# Patient Record
Sex: Female | Born: 1965 | Race: White | Hispanic: No | Smoking: Former smoker
Health system: Southern US, Community
[De-identification: ages and names within clinical notes are randomized; demographics above are authoritative.]

## PROBLEM LIST (undated history)

## (undated) DIAGNOSIS — G43909 Migraine, unspecified, not intractable, without status migrainosus: Secondary | ICD-10-CM

## (undated) DIAGNOSIS — R42 Dizziness and giddiness: Secondary | ICD-10-CM

## (undated) DIAGNOSIS — M549 Dorsalgia, unspecified: Secondary | ICD-10-CM

## (undated) DIAGNOSIS — F3281 Premenstrual dysphoric disorder: Secondary | ICD-10-CM

## (undated) DIAGNOSIS — N870 Mild cervical dysplasia: Secondary | ICD-10-CM

## (undated) DIAGNOSIS — F419 Anxiety disorder, unspecified: Secondary | ICD-10-CM

## (undated) DIAGNOSIS — E282 Polycystic ovarian syndrome: Secondary | ICD-10-CM

## (undated) DIAGNOSIS — F1020 Alcohol dependence, uncomplicated: Secondary | ICD-10-CM

## (undated) DIAGNOSIS — F909 Attention-deficit hyperactivity disorder, unspecified type: Secondary | ICD-10-CM

## (undated) DIAGNOSIS — I10 Essential (primary) hypertension: Secondary | ICD-10-CM

## (undated) DIAGNOSIS — K2270 Barrett's esophagus without dysplasia: Secondary | ICD-10-CM

## (undated) DIAGNOSIS — E559 Vitamin D deficiency, unspecified: Secondary | ICD-10-CM

## (undated) DIAGNOSIS — F319 Bipolar disorder, unspecified: Secondary | ICD-10-CM

## (undated) DIAGNOSIS — J449 Chronic obstructive pulmonary disease, unspecified: Secondary | ICD-10-CM

## (undated) DIAGNOSIS — F4310 Post-traumatic stress disorder, unspecified: Secondary | ICD-10-CM

## (undated) DIAGNOSIS — J439 Emphysema, unspecified: Secondary | ICD-10-CM

## (undated) HISTORY — PX: MOUTH SURGERY: SHX715

## (undated) HISTORY — PX: NASAL SEPTUM SURGERY: SHX37

## (undated) HISTORY — DX: Dizziness and giddiness: R42

## (undated) HISTORY — DX: Polycystic ovarian syndrome: E28.2

## (undated) HISTORY — PX: TONSILECTOMY, ADENOIDECTOMY, BILATERAL MYRINGOTOMY AND TUBES: SHX2538

## (undated) HISTORY — DX: Mild cervical dysplasia: N87.0

## (undated) HISTORY — DX: Migraine, unspecified, not intractable, without status migrainosus: G43.909

## (undated) HISTORY — DX: Dorsalgia, unspecified: M54.9

## (undated) HISTORY — DX: Alcohol dependence, uncomplicated: F10.20

## (undated) HISTORY — DX: Post-traumatic stress disorder, unspecified: F43.10

## (undated) HISTORY — DX: Emphysema, unspecified: J43.9

## (undated) HISTORY — DX: Anxiety disorder, unspecified: F41.9

## (undated) HISTORY — DX: Premenstrual dysphoric disorder: F32.81

## (undated) HISTORY — DX: Barrett's esophagus without dysplasia: K22.70

## (undated) HISTORY — DX: Chronic obstructive pulmonary disease, unspecified: J44.9

## (undated) HISTORY — DX: Attention-deficit hyperactivity disorder, unspecified type: F90.9

## (undated) HISTORY — DX: Essential (primary) hypertension: I10

## (undated) HISTORY — DX: Vitamin D deficiency, unspecified: E55.9

## (undated) HISTORY — PX: BACK SURGERY: SHX140

## (undated) HISTORY — DX: Bipolar disorder, unspecified: F31.9

---

## 1993-12-11 HISTORY — PX: CERVICAL BIOPSY  W/ LOOP ELECTRODE EXCISION: SUR135

## 1993-12-11 HISTORY — PX: COLPOSCOPY: SHX161

## 2001-08-14 ENCOUNTER — Other Ambulatory Visit: Admission: RE | Admit: 2001-08-14 | Discharge: 2001-08-14 | Payer: Self-pay | Admitting: Obstetrics and Gynecology

## 2002-10-03 ENCOUNTER — Other Ambulatory Visit: Admission: RE | Admit: 2002-10-03 | Discharge: 2002-10-03 | Payer: Self-pay | Admitting: Obstetrics and Gynecology

## 2005-05-01 ENCOUNTER — Encounter
Admission: RE | Admit: 2005-05-01 | Discharge: 2005-07-30 | Payer: Self-pay | Admitting: Physical Medicine and Rehabilitation

## 2005-05-03 ENCOUNTER — Ambulatory Visit: Payer: Self-pay | Admitting: Physical Medicine and Rehabilitation

## 2005-06-08 ENCOUNTER — Ambulatory Visit: Payer: Self-pay | Admitting: Physical Medicine and Rehabilitation

## 2005-06-22 ENCOUNTER — Other Ambulatory Visit: Admission: RE | Admit: 2005-06-22 | Discharge: 2005-06-22 | Payer: Self-pay | Admitting: Addiction Medicine

## 2005-06-27 ENCOUNTER — Encounter
Admission: RE | Admit: 2005-06-27 | Discharge: 2005-08-11 | Payer: Self-pay | Admitting: Physical Medicine and Rehabilitation

## 2005-07-18 ENCOUNTER — Ambulatory Visit: Payer: Self-pay | Admitting: Physical Medicine and Rehabilitation

## 2005-08-11 ENCOUNTER — Encounter
Admission: RE | Admit: 2005-08-11 | Discharge: 2005-11-09 | Payer: Self-pay | Admitting: Physical Medicine and Rehabilitation

## 2005-12-12 ENCOUNTER — Encounter: Admission: RE | Admit: 2005-12-12 | Discharge: 2006-03-12 | Payer: Self-pay | Admitting: Family Medicine

## 2006-12-09 ENCOUNTER — Emergency Department (HOSPITAL_COMMUNITY): Admission: EM | Admit: 2006-12-09 | Discharge: 2006-12-09 | Payer: Self-pay | Admitting: Emergency Medicine

## 2007-11-03 ENCOUNTER — Emergency Department (HOSPITAL_COMMUNITY): Admission: EM | Admit: 2007-11-03 | Discharge: 2007-11-03 | Payer: Self-pay | Admitting: Family Medicine

## 2008-04-13 ENCOUNTER — Emergency Department (HOSPITAL_COMMUNITY): Admission: EM | Admit: 2008-04-13 | Discharge: 2008-04-13 | Payer: Self-pay | Admitting: Family Medicine

## 2008-08-15 ENCOUNTER — Emergency Department (HOSPITAL_COMMUNITY): Admission: EM | Admit: 2008-08-15 | Discharge: 2008-08-15 | Payer: Self-pay | Admitting: Emergency Medicine

## 2008-11-19 ENCOUNTER — Other Ambulatory Visit: Admission: RE | Admit: 2008-11-19 | Discharge: 2008-11-19 | Payer: Self-pay | Admitting: Obstetrics and Gynecology

## 2008-11-19 ENCOUNTER — Ambulatory Visit: Payer: Self-pay | Admitting: Obstetrics and Gynecology

## 2008-11-19 ENCOUNTER — Encounter: Payer: Self-pay | Admitting: Obstetrics and Gynecology

## 2008-11-23 ENCOUNTER — Ambulatory Visit: Payer: Self-pay | Admitting: Obstetrics and Gynecology

## 2009-04-28 ENCOUNTER — Ambulatory Visit: Payer: Self-pay | Admitting: Obstetrics and Gynecology

## 2009-04-29 ENCOUNTER — Ambulatory Visit: Payer: Self-pay | Admitting: Obstetrics and Gynecology

## 2009-05-14 ENCOUNTER — Emergency Department (HOSPITAL_COMMUNITY): Admission: EM | Admit: 2009-05-14 | Discharge: 2009-05-14 | Payer: Self-pay | Admitting: Family Medicine

## 2009-05-31 ENCOUNTER — Encounter: Admission: RE | Admit: 2009-05-31 | Discharge: 2009-05-31 | Payer: Self-pay | Admitting: Obstetrics and Gynecology

## 2009-07-02 ENCOUNTER — Encounter: Admission: RE | Admit: 2009-07-02 | Discharge: 2009-07-02 | Payer: Self-pay | Admitting: Family Medicine

## 2009-07-05 ENCOUNTER — Ambulatory Visit: Payer: Self-pay | Admitting: Internal Medicine

## 2009-07-07 ENCOUNTER — Inpatient Hospital Stay (HOSPITAL_COMMUNITY): Admission: EM | Admit: 2009-07-07 | Discharge: 2009-07-09 | Payer: Self-pay | Admitting: Emergency Medicine

## 2009-07-12 LAB — CBC WITH DIFFERENTIAL/PLATELET
BASO%: 1.1 % (ref 0.0–2.0)
Eosinophils Absolute: 0.1 10*3/uL (ref 0.0–0.5)
HCT: 40.9 % (ref 34.8–46.6)
LYMPH%: 17.5 % (ref 14.0–49.7)
MONO#: 0.6 10*3/uL (ref 0.1–0.9)
NEUT#: 8.5 10*3/uL — ABNORMAL HIGH (ref 1.5–6.5)
NEUT%: 75.4 % (ref 38.4–76.8)
Platelets: 343 10*3/uL (ref 145–400)
RBC: 4.41 10*6/uL (ref 3.70–5.45)
WBC: 11.3 10*3/uL — ABNORMAL HIGH (ref 3.9–10.3)
lymph#: 2 10*3/uL (ref 0.9–3.3)

## 2009-07-14 LAB — COMPREHENSIVE METABOLIC PANEL
ALT: 32 U/L (ref 0–35)
CO2: 29 mEq/L (ref 19–32)
Calcium: 9.3 mg/dL (ref 8.4–10.5)
Chloride: 102 mEq/L (ref 96–112)
Glucose, Bld: 145 mg/dL — ABNORMAL HIGH (ref 70–99)
Sodium: 139 mEq/L (ref 135–145)
Total Bilirubin: 0.2 mg/dL — ABNORMAL LOW (ref 0.3–1.2)
Total Protein: 6.1 g/dL (ref 6.0–8.3)

## 2009-07-14 LAB — LACTATE DEHYDROGENASE: LDH: 178 U/L (ref 94–250)

## 2009-08-26 ENCOUNTER — Ambulatory Visit: Payer: Self-pay | Admitting: Obstetrics and Gynecology

## 2009-10-08 ENCOUNTER — Encounter: Admission: RE | Admit: 2009-10-08 | Discharge: 2009-10-08 | Payer: Self-pay | Admitting: Orthopedic Surgery

## 2009-10-29 ENCOUNTER — Emergency Department (HOSPITAL_COMMUNITY): Admission: EM | Admit: 2009-10-29 | Discharge: 2009-10-30 | Payer: Self-pay | Admitting: Emergency Medicine

## 2009-10-30 ENCOUNTER — Ambulatory Visit: Payer: Self-pay | Admitting: Psychiatry

## 2009-10-30 ENCOUNTER — Inpatient Hospital Stay (HOSPITAL_COMMUNITY): Admission: AD | Admit: 2009-10-30 | Discharge: 2009-11-01 | Payer: Self-pay | Admitting: Psychiatry

## 2009-12-07 ENCOUNTER — Other Ambulatory Visit: Admission: RE | Admit: 2009-12-07 | Discharge: 2009-12-07 | Payer: Self-pay | Admitting: Obstetrics and Gynecology

## 2009-12-07 ENCOUNTER — Ambulatory Visit: Payer: Self-pay | Admitting: Obstetrics and Gynecology

## 2010-03-14 ENCOUNTER — Ambulatory Visit: Payer: Self-pay | Admitting: Obstetrics and Gynecology

## 2010-04-06 ENCOUNTER — Ambulatory Visit: Payer: Self-pay | Admitting: Obstetrics and Gynecology

## 2010-04-08 ENCOUNTER — Ambulatory Visit: Payer: Self-pay | Admitting: Obstetrics and Gynecology

## 2010-09-05 ENCOUNTER — Ambulatory Visit: Payer: Self-pay | Admitting: Gynecology

## 2010-09-07 ENCOUNTER — Ambulatory Visit: Payer: Self-pay | Admitting: Obstetrics and Gynecology

## 2010-09-08 ENCOUNTER — Ambulatory Visit: Payer: Self-pay | Admitting: Obstetrics and Gynecology

## 2010-09-14 ENCOUNTER — Encounter: Admission: RE | Admit: 2010-09-14 | Discharge: 2010-09-14 | Payer: Self-pay | Admitting: Family Medicine

## 2010-09-15 ENCOUNTER — Encounter: Admission: RE | Admit: 2010-09-15 | Discharge: 2010-09-15 | Payer: Self-pay | Admitting: Family Medicine

## 2010-09-28 ENCOUNTER — Ambulatory Visit: Payer: Self-pay | Admitting: Obstetrics and Gynecology

## 2010-10-02 ENCOUNTER — Ambulatory Visit (HOSPITAL_COMMUNITY): Admission: RE | Admit: 2010-10-02 | Discharge: 2010-10-02 | Payer: Self-pay | Admitting: Psychiatry

## 2010-10-04 ENCOUNTER — Other Ambulatory Visit (HOSPITAL_COMMUNITY): Admission: RE | Admit: 2010-10-04 | Discharge: 2010-10-10 | Payer: Self-pay | Admitting: Psychiatry

## 2010-10-07 ENCOUNTER — Ambulatory Visit: Payer: Self-pay | Admitting: Obstetrics and Gynecology

## 2010-10-13 ENCOUNTER — Ambulatory Visit: Payer: Self-pay | Admitting: Psychiatry

## 2011-01-01 ENCOUNTER — Encounter: Payer: Self-pay | Admitting: Obstetrics and Gynecology

## 2011-01-02 ENCOUNTER — Encounter: Payer: Self-pay | Admitting: Family Medicine

## 2011-01-02 ENCOUNTER — Encounter: Payer: Self-pay | Admitting: Gastroenterology

## 2011-01-24 ENCOUNTER — Inpatient Hospital Stay (HOSPITAL_COMMUNITY)
Admission: AD | Admit: 2011-01-24 | Discharge: 2011-01-28 | DRG: 885 | Disposition: A | Discharge status: Home / Self Care | Payer: PRIVATE HEALTH INSURANCE | Source: Ambulatory Visit | Attending: Psychiatry | Admitting: Psychiatry

## 2011-01-24 ENCOUNTER — Emergency Department (HOSPITAL_COMMUNITY)
Admission: EM | Admit: 2011-01-24 | Discharge: 2011-01-24 | Disposition: A | Discharge status: Other Acute Inpatient Hospital | Payer: PRIVATE HEALTH INSURANCE | Attending: Emergency Medicine | Admitting: Emergency Medicine

## 2011-01-24 DIAGNOSIS — F909 Attention-deficit hyperactivity disorder, unspecified type: Secondary | ICD-10-CM | POA: Insufficient documentation

## 2011-01-24 DIAGNOSIS — E119 Type 2 diabetes mellitus without complications: Secondary | ICD-10-CM | POA: Insufficient documentation

## 2011-01-24 DIAGNOSIS — J449 Chronic obstructive pulmonary disease, unspecified: Secondary | ICD-10-CM

## 2011-01-24 DIAGNOSIS — F319 Bipolar disorder, unspecified: Secondary | ICD-10-CM | POA: Insufficient documentation

## 2011-01-24 DIAGNOSIS — J45909 Unspecified asthma, uncomplicated: Secondary | ICD-10-CM | POA: Insufficient documentation

## 2011-01-24 DIAGNOSIS — R45851 Suicidal ideations: Secondary | ICD-10-CM

## 2011-01-24 DIAGNOSIS — F4310 Post-traumatic stress disorder, unspecified: Secondary | ICD-10-CM | POA: Insufficient documentation

## 2011-01-24 DIAGNOSIS — R51 Headache: Secondary | ICD-10-CM

## 2011-01-24 DIAGNOSIS — J4489 Other specified chronic obstructive pulmonary disease: Secondary | ICD-10-CM

## 2011-01-24 DIAGNOSIS — F3130 Bipolar disorder, current episode depressed, mild or moderate severity, unspecified: Principal | ICD-10-CM

## 2011-01-24 LAB — CBC
MCH: 32.1 pg (ref 26.0–34.0)
MCHC: 34.2 g/dL (ref 30.0–36.0)
MCV: 93.9 fL (ref 78.0–100.0)
Platelets: 433 10*3/uL — ABNORMAL HIGH (ref 150–400)
RDW: 12.8 % (ref 11.5–15.5)
WBC: 15.1 10*3/uL — ABNORMAL HIGH (ref 4.0–10.5)

## 2011-01-24 LAB — COMPREHENSIVE METABOLIC PANEL
Albumin: 3.8 g/dL (ref 3.5–5.2)
BUN: 10 mg/dL (ref 6–23)
Calcium: 9 mg/dL (ref 8.4–10.5)
Creatinine, Ser: 0.89 mg/dL (ref 0.4–1.2)
Potassium: 4 mEq/L (ref 3.5–5.1)
Total Protein: 6.9 g/dL (ref 6.0–8.3)

## 2011-01-24 LAB — RAPID URINE DRUG SCREEN, HOSP PERFORMED
Amphetamines: POSITIVE — AB
Barbiturates: NOT DETECTED
Benzodiazepines: POSITIVE — AB
Cocaine: NOT DETECTED
Opiates: NOT DETECTED

## 2011-01-24 LAB — DIFFERENTIAL
Eosinophils Absolute: 0.1 10*3/uL (ref 0.0–0.7)
Eosinophils Relative: 1 % (ref 0–5)
Lymphs Abs: 3.4 10*3/uL (ref 0.7–4.0)

## 2011-01-24 LAB — CARBAMAZEPINE LEVEL, TOTAL: Carbamazepine Lvl: <2 ug/mL — ABNORMAL LOW (ref 4.0–12.0)

## 2011-01-25 DIAGNOSIS — F319 Bipolar disorder, unspecified: Secondary | ICD-10-CM

## 2011-01-25 LAB — GLUCOSE, CAPILLARY: Glucose-Capillary: 108 mg/dL — ABNORMAL HIGH (ref 70–99)

## 2011-01-26 LAB — URINALYSIS, ROUTINE W REFLEX MICROSCOPIC
Bilirubin Urine: NEGATIVE
Ketones, ur: NEGATIVE mg/dL
Nitrite: NEGATIVE
Protein, ur: NEGATIVE mg/dL
Urine Glucose, Fasting: NEGATIVE mg/dL
pH: 7.5 (ref 5.0–8.0)

## 2011-01-26 LAB — GLUCOSE, CAPILLARY
Glucose-Capillary: 103 mg/dL — ABNORMAL HIGH (ref 70–99)
Glucose-Capillary: 98 mg/dL (ref 70–99)

## 2011-01-30 LAB — GLUCOSE, CAPILLARY: Glucose-Capillary: 112 mg/dL — ABNORMAL HIGH (ref 70–99)

## 2011-02-01 NOTE — Discharge Summary (Signed)
Angelica [name], [name]             ACCOUNT NO.:  [id number]  MEDICAL RECORD NO.:  [id number]           PATIENT TYPE:  I  LOCATION:  0400                          FACILITY:  [facility]  PHYSICIAN:  Eulogio Ditch, MD DATE OF BIRTH:  May 24, 1966  DATE OF ADMISSION:  01/24/2011 DATE OF DISCHARGE:  01/28/2011                              DISCHARGE SUMMARY   IDENTIFYING INFORMATION:  A 45 year old female.  This is a voluntary admission.  HISTORY OF PRESENT ILLNESS:  [name] presented complaining of depression and suicidal thoughts after having had only few hours of sleep.  Had some conflict with her mother regarding some other family stressors and also was complaining of significant headache.  She denied any substance abuse.  This is her second admission to the unit, last here in November 2010 for an overdose of Klonopin.  She is followed as an outpatient by Dr. Phillip Heal.  An additional stressor was her process of recently transitioning from Tegretol to lithium.  She has a history of mood disorder.  MEDICAL EVALUATION/DIAGNOSTIC STUDIES:  Physical exam was done in the emergency room, and as noted in the record, this is a normally developed female appearing to be her stated age with a nonfocal neuro exam, smooth motor, and in full contact with reality.  CHRONIC MEDICAL PROBLEMS: 1. Perennial allergies. 2. Asthma. 3. History of attention deficit. 4. Diabetes. 5. Bipolar disorder.  CBC was unremarkable.  Electrolytes normal.  BUN 10, creatinine 0.88. Normal liver enzymes.  Urine drug screen positive for benzodiazepines and amphetamines.  Lithium level 0.38.  Carbamazepine level less than 2.  ADMITTING MENTAL STATUS EXAM:  Fully alert, cooperative, normally- developed.  Appears to be her stated age.  Mood anxious.  Affect congruent, somewhat tearful, slightly labile.  Wanting to be back on all of her medications and asking for help getting stabilized on her meds. Thought  processes coherent and thinking nonpsychotic.  No delusional statements.  No flight of ideas.  Immediate, recent, remote memory intact.  Cognition intact.  DIAGNOSES:  AXIS I:  Bipolar disorder, not otherwise specified.  AXIS II:  Deferred.  AXIS III: 1. Chronic obstructive pulmonary disease . 2. Type 2 diabetes. 3. Seasonal allergies. 4. Headaches.  AXIS IV:  Psychosocial issues and some chronic domestic conflict.  AXIS V:  Current 35, past year not known.  COURSE OF HOSPITALIZATION:  She was admitted to our stabilization unit, and at one point was taking Seroquel, more recently taking Geodon with discussion of risks and benefits.  She agreed to discontinue the Geodon, and we elected to continue her Seroquel at 600 mg p.o. q.h.s.  To treat her headache, we initially started her on Excedrin P.M., but her response was suboptimal.  She characterized this is a typical migraine headache, and she responded well to 30 mg of Toradol IM along with Phenergan 25 mg IM on the morning of January 26, 2011. The morning of 17th, affect was much fuller, more relaxed.  Feeling subjectively much better.  Thinking was more logical, goal-directed. She looked much more relaxed.  She reported that she had been recovering from an eating disorder and agoraphobia.  Endorsing that she needed to have more structure in her life.  By the 18th, affect full, sleep and appetite good.  Headache resolved. Endorsed strong support from her family.  She was in agreement with the plan to discharge.  DISCHARGE DIAGNOSES:  AXIS I:  Bipolar disorder, depressed.  AXIS II:  No diagnosis.  AXIS III: 1. Headache, resolved. 2. Asthma. 3. Type 2 diabetes, stable.  AXIS IV:  Supportive family and stable home situation is an asset.  AXIS V:  Current 56, past year not known.  DISCHARGE MEDICATIONS: 1. Klonopin 1 mg q.a.m. 2. Klonopin 2.5 mg q.h.s. 3. Seroquel 600 mg q.h.s. 4. Sertraline 100 mg daily. 5.  Ambien 10 mg q.h.s. 6. Combivent 2 puffs q.6h. p.r.n. asthma. 7. Dulera 2 puffs b.i.d. 8. Flonase 2 sprays daily, both nostrils. 9. Lithium carbonate 300 mg b.i.d. 10.Metformin 500 mg b.i.d. 11.Multivitamin 1 daily. 12.Singulair 10 mg daily. 13.Topamax 100 mg b.i.d. 14.Vitamin D2 50,000 units weekly on Saturdays. 15.Vyvanse 70 mg q.a.m. 16.Xopenex via nebulizer q.6h. as needed for asthma.  She was instructed to stop the sertraline 150 mg q.a.m. and to stop the Tegretol and the Geodon and the pseudoephedrine that she had previously been taking.     Margaret A. Lorin Picket, N.P.   ______________________________ Eulogio Ditch, MD    MAS/MEDQ  D:  01/30/2011  T:  01/30/2011  Job:  [id number]  Electronically Signed by Kari Baars N.P. on 01/31/2011 08:56:42 AM Electronically Signed by Eulogio Ditch  on 02/01/2011 04:48:20 AM

## 2011-02-01 NOTE — H&P (Signed)
Angelica [name], Angelica [name]             ACCOUNT NO.:  [id number]  MEDICAL RECORD NO.:  [id number]           PATIENT TYPE:  I  LOCATION:  0400                          [facility]ACILITY:  [facility]  PHYSICIAN:  Eulogio Ditch, MD DATE OF BIRTH:  02/05/1966  DATE OF ADMISSION:  01/24/2011 DATE OF DISCHARGE:                      PSYCHIATRIC ADMISSION ASSESSMENT   IDENTIFICATION:  This is a 45 year old female voluntarily admitted on January 24, 2011.  HISTORY OF PRESENT ILLNESS:  The patient was seen in conjunction with Eulogio Ditch, MD  The patient was admitted, assessed in the emergency department for depression and suicidal thoughts, having a few hours of sleep.  She had no plan, reporting an argument with her mother having some other significant stressors. Also complaining of headache.  Denies any substance use.  PAST PSYCHIATRIC HISTORY:  This is her second admission. The patient was last here in November 2010 for an overdose on Klonopin and her outpatient provider is Dr. Phillip Heal.  SOCIAL HISTORY:  This is a single female.  She resides in [place]. She is self-employed.  No legal issues.  FAMILY HISTORY:  None.  ALCOHOL/DRUG HISTORY:  The patient denies any recurrent alcohol or substance use.  Does have a past history of opiate use.  PRIMARY CARE PROVIDER:  Dr. Laurine Blazer.  MEDICAL PROBLEMS:  A history of type 2 diabetes, asthma and headaches. Seasonal allergies.  MEDICATIONS: 1. __________ 200 mg +500 two puffs b.i.d. 2. Vitamin D 50,000 units one weekly. 3. Multivitamin one daily. 4. Ocean nasal spray 2 sprays daily. 5. Pseudoephedrine 120 one tablet t.i.d. 6. Tylenol as needed. 7. Zyrtec q.h.s. 8. __________  spray. 9. Zoloft 150 mg daily. 10.Xopenex inhaled. 11.Vyvanse 70 mg daily. 12.Topamax 100 mg b.i.d. 13.Tegretol 200 mg 1-1/2 tablets daily. 14.Singulair 10 mg daily. 15.Seroquel 600 mg q.h.s. 16.Metformin 500 mg 1 tablet b.i.d. 17.Lithium carbonate 300  mg one b.i.d. 18.Lidocaine as needed. 19.Klonopin 1 mg t.i.d. 2.5 q.h.s. 20.Geodon 80 mg one b.i.d. 21.Flonase 2 sprays daily. 22.Combivent 2 puffs q.6 h p.r.n. 23.Ambien daily. 24.Xopenex inhaler as needed.  DRUG ALLERGIES:  CODEINE.  PHYSICAL EXAM:  This is a normally-developed female assessed in the emergency department.  Her physical exam was reviewed noted to be tremulous. Cardiovascular normal sinus rhythm at time of exam.  She was also anxious, argumentative and tearful.  LABORATORY DATA:  Glucose 114.  CBC shows a white count at 15.1.  Urine drug screen positive for benzodiazepines, positive for amphetamines, lithium level of 0.38, carbamazepine less than 2, alcohol level less than 5.  MENTAL STATUS EXAM:  The patient is fully alert and cooperative.  She did appear anxious and tearful and having a labile mood, asking to be put back on her medications.  Her thought processes are coherent and does not appear to be psychotic.  She denies any suicidal thoughts but as stated does appear anxious and seems have some labile mood.  AXIS I:  Bipolar disorder. AXIS II:  Deferred. AXIS III:  COPD and type 2 diabetes, seasonal allergies and headaches. AXIS IV:  Psychosocial problems, possible problems with a primary support of her mother. AXIS V:  Current is 35-40.  Our  plan is to review her medication regime.  We will attempt to simplify her medications.  We will stop her Geodon and continue with her Seroquel.  The patient reports her Tegretol was being crossed tapered with lithium.  We will check her CBG's b.i.d.  Contact mother for a family session and concerns, have Excedrin PM available for her headaches.  We will also decrease her Zoloft to 100 mg as that may be adding to some of her mood instability.  Her tentative length of stay at this time is 4 to 5 days.     Landry Corporal, N.P.   ______________________________ Eulogio Ditch, MD    JO/MEDQ  D:   01/27/2011  T:  01/27/2011  Job:  [id number]  Electronically Signed by Limmie PatriciaP. on 01/30/2011 09:53:45 AM Electronically Signed by Eulogio Ditch  on 02/01/2011 04:48:11 AM

## 2011-03-15 LAB — BASIC METABOLIC PANEL
BUN: 7 mg/dL (ref 6–23)
CO2: 30 mEq/L (ref 19–32)
GFR calc non Af Amer: >60 mL/min (ref >60)
Glucose, Bld: 117 mg/dL — ABNORMAL HIGH (ref 70–99)
Potassium: 3.6 mEq/L (ref 3.5–5.1)
Sodium: 140 mEq/L (ref 135–145)

## 2011-03-15 LAB — DIFFERENTIAL
Basophils Absolute: 0.3 10*3/uL — ABNORMAL HIGH (ref 0.0–0.1)
Basophils Relative: 2 % — ABNORMAL HIGH (ref 0–1)
Eosinophils Absolute: 0.1 10*3/uL (ref 0.0–0.7)
Eosinophils Relative: 0 % (ref 0–5)
Lymphocytes Relative: 27 % (ref 12–46)
Monocytes Absolute: 1 10*3/uL (ref 0.1–1.0)

## 2011-03-15 LAB — HEPATIC FUNCTION PANEL
AST: 19 U/L (ref 0–37)
Bilirubin, Direct: <0.1 mg/dL (ref 0.0–0.3)
Total Bilirubin: 0.5 mg/dL (ref 0.3–1.2)

## 2011-03-15 LAB — CBC
HCT: 43.4 % (ref 36.0–46.0)
Hemoglobin: 14.7 g/dL (ref 12.0–15.0)
MCHC: 33.9 g/dL (ref 30.0–36.0)
MCV: 93.7 fL (ref 78.0–100.0)
Platelets: 403 10*3/uL — ABNORMAL HIGH (ref 150–400)
RDW: 14 % (ref 11.5–15.5)

## 2011-03-15 LAB — ACETAMINOPHEN LEVEL: Acetaminophen (Tylenol), Serum: <10 ug/mL — ABNORMAL LOW (ref 10–30)

## 2011-03-15 LAB — RAPID URINE DRUG SCREEN, HOSP PERFORMED
Benzodiazepines: POSITIVE — AB
Cocaine: NOT DETECTED
Opiates: NOT DETECTED

## 2011-03-15 LAB — SALICYLATE LEVEL: Salicylate Lvl: <4 mg/dL (ref 2.8–20.0)

## 2011-03-19 LAB — CBC
HCT: 42 % (ref 36.0–46.0)
Hemoglobin: 14.2 g/dL (ref 12.0–15.0)
MCV: 93.2 fL (ref 78.0–100.0)
Platelets: 377 10*3/uL (ref 150–400)
Platelets: 429 10*3/uL — ABNORMAL HIGH (ref 150–400)
RBC: 4.3 MIL/uL (ref 3.87–5.11)
RBC: 4.5 MIL/uL (ref 3.87–5.11)
RBC: 4.72 MIL/uL (ref 3.87–5.11)
RDW: 13.8 % (ref 11.5–15.5)
WBC: 13 10*3/uL — ABNORMAL HIGH (ref 4.0–10.5)
WBC: 13.9 10*3/uL — ABNORMAL HIGH (ref 4.0–10.5)

## 2011-03-19 LAB — URINE CULTURE: Colony Count: 35000

## 2011-03-19 LAB — COMPREHENSIVE METABOLIC PANEL
ALT: 36 U/L — ABNORMAL HIGH (ref 0–35)
ALT: 36 U/L — ABNORMAL HIGH (ref 0–35)
AST: 27 U/L (ref 0–37)
AST: 28 U/L (ref 0–37)
Albumin: 3 g/dL — ABNORMAL LOW (ref 3.5–5.2)
Albumin: 3.4 g/dL — ABNORMAL LOW (ref 3.5–5.2)
CO2: 27 mEq/L (ref 19–32)
Calcium: 9.1 mg/dL (ref 8.4–10.5)
Chloride: 103 mEq/L (ref 96–112)
Chloride: 107 mEq/L (ref 96–112)
Creatinine, Ser: 0.73 mg/dL (ref 0.4–1.2)
GFR calc Af Amer: >60 mL/min (ref >60)
GFR calc Af Amer: >60 mL/min (ref >60)
GFR calc non Af Amer: >60 mL/min (ref >60)
Sodium: 141 mEq/L (ref 135–145)
Sodium: 141 mEq/L (ref 135–145)
Total Bilirubin: 0.4 mg/dL (ref 0.3–1.2)
Total Bilirubin: 0.5 mg/dL (ref 0.3–1.2)

## 2011-03-19 LAB — DIFFERENTIAL
Eosinophils Absolute: 0.1 10*3/uL (ref 0.0–0.7)
Eosinophils Relative: 1 % (ref 0–5)
Lymphocytes Relative: 20 % (ref 12–46)
Lymphs Abs: 2.7 10*3/uL (ref 0.7–4.0)
Monocytes Absolute: 1 10*3/uL (ref 0.1–1.0)

## 2011-03-19 LAB — LIPID PANEL
Cholesterol: 174 mg/dL (ref 0–200)
LDL Cholesterol: 111 mg/dL — ABNORMAL HIGH (ref 0–99)
Total CHOL/HDL Ratio: 3.9 RATIO
Triglycerides: 90 mg/dL (ref <150)
VLDL: 18 mg/dL (ref 0–40)

## 2011-03-19 LAB — AMYLASE: Amylase: 131 U/L (ref 27–131)

## 2011-03-19 LAB — RAPID URINE DRUG SCREEN, HOSP PERFORMED
Barbiturates: NOT DETECTED
Opiates: NOT DETECTED

## 2011-03-19 LAB — URINALYSIS, ROUTINE W REFLEX MICROSCOPIC
Glucose, UA: NEGATIVE mg/dL
Hgb urine dipstick: NEGATIVE
Protein, ur: NEGATIVE mg/dL
pH: 6 (ref 5.0–8.0)

## 2011-03-19 LAB — APTT: aPTT: 30 seconds (ref 24–37)

## 2011-03-19 LAB — GLUCOSE, CAPILLARY: Glucose-Capillary: 116 mg/dL — ABNORMAL HIGH (ref 70–99)

## 2011-04-25 NOTE — Discharge Summary (Signed)
NAMERAYOLA, EVERHART             ACCOUNT NO.:  [id number]   MEDICAL RECORD NO.:  [id number]          PATIENT TYPE:  INP   LOCATION:  1441                         FACILITY:  [facility]   PHYSICIAN:  Corinna L. Lendell Caprice, MDDATE OF BIRTH:  [birth year]   DATE OF ADMISSION:  07/07/2009  DATE OF DISCHARGE:  07/09/2009                               DISCHARGE SUMMARY   DISCHARGE DIAGNOSES:  1. Abdominal pain, improved.  2. Nausea, improved.  3. Elevated lipase with normal amylase and normal appearance of      pancreas on CAT scan.  4. Bipolar disorder.  5. Tobacco abuse, counseled against.  6. Right renal cyst.  7. Chronic leukocytosis per patient report.   DISCHARGE MEDICATIONS:  1. Percocet 5/325 one p.o. q.4 h. p.r.n. pain, 15 were dispensed.  2. Phenergan 12.5 mg p.o. q.4 h. p.r.n. nausea.  3. Continue Geodon 80 mg in the afternoon and 160 mg at night.  4. Klonopin 2 mg at night.  5. Zoloft 100 mg a day.  6. Vyvanse 70 mg a day.  7. Xopenex as needed.  8. Fanapt 4 mg twice a day.  9. Alvesco twice a day.  10.Nasacort inhaler twice a day.  11.Advair daily.  12.Tramadol 50 mg as needed.   CONDITION:  Stable.   ACTIVITY:  Ad lib.   FOLLOW UP:  1. Follow up with Dr. Paulino Rily next week.  2. The patient also reportedly has an outpatient appointment scheduled      with a hematologist/oncologist for chronic leukocytosis.   DIET:  Bland, low-fat.   DISCHARGE INSTRUCTIONS:  She is encouraged to quit smoking.   CONSULTATIONS:  None.   PROCEDURE:  None.   LABORATORY DATA:  Initial white blood cell count was 13,000 with a  normal differential, 429 platelets, otherwise normal CBC.  PT/PTT  normal.  Complete metabolic panel was essentially normal.  LDL 111, HDL  45, triglycerides 90, TSH 1.080.  Urine drug screen positive for  amphetamines and benzodiazepines.  Urinalysis negative.  Urine culture  negative.   DIAGNOSTICS:  1. EKG showed normal sinus rhythm.  2. Ultrasound had been  done as an outpatient prior to admission on      July 02, 2009 which showed no cholelithiasis or cholecystitis,      hepatic steatosis, large minimally complex 2 cm right renal cystic      lesion.  3. CT of the abdomen and pelvis on admission showed diffuse fatty      infiltration of the liver.  Spleen, pancreas, adrenal glands      unremarkable.  Gallbladder and biliary area system unremarkable.      Exophytic right mid kidney cyst.  Nonobstructive 2 mm left kidney      upper pole calculus, slight increase in size.  Simple appearing      right renal cyst.  Small cyst or follicle on the right ovary.  4. Two views of the chest showed minimal bronchitic change.  5. MRI of the abdomen showed Bosniak category II cyst of the right      kidney with a small hemorrhagic component, but no enhancement  or      worrisome imaging characteristics, consider benign, small right      ovarian cyst, new atelectasis at the right lower lobe, diffuse      hepatic steatosis.  6. HIDA scan was normal.   HISTORY/HOSPITAL COURSE:  Ms. Celaya is a 45 year old white female  patient of Dr. Paulino Rily who presented with a several day onset of  worsening abdominal pain, particularly in the right upper quadrant.  She  had nausea as well.  She was noted to have a leukocytosis and a slightly  elevated lipase of 110.  She had an outpatient ultrasound which was  unremarkable.  She was afebrile on admission.  She has slight right  upper quadrant tenderness and right flank tenderness.  She was admitted.  She eventually gave the history that her white blood cell count has been  elevated chronically and she is being referred to Hem/Onc as an  outpatient.  She was given supportive care.  There was concern about  possible pancreatitis based on enzymes, but her CAT scan did not show  inflammation.  Nevertheless, the patient's pain and nausea improved.  At  the time of discharge, she was tolerating a solid diet.  Her pain had   improved.  She had no tenderness.  It is unclear what caused this, but  certainly she is feeling much better and is clinically stable.  However,  I have asked that she follow closely with her primary care physician.  In case her symptoms continue, she may need further workup.   Total time on the day of discharge is 45 minutes.      Corinna L. Lendell Caprice, MD  Electronically Signed     CLS/MEDQ  D:  07/09/2009  T:  07/10/2009  Job:  [id number]   cc:   Emeterio Reeve, MD  Fax: [phone]

## 2011-04-25 NOTE — H&P (Signed)
Angelica [name], Angelica [name]             ACCOUNT NO.:  [id number]   MEDICAL RECORD NO.:  [id number]          PATIENT TYPE:  INP   LOCATION:  1441                         [facility]   PHYSICIAN:  Michiel Cowboy, MDDATE OF BIRTH:  01/28/1966   DATE OF ADMISSION:  07/07/2009  DATE OF DISCHARGE:                              HISTORY & PHYSICAL   CHIEF COMPLAINT:  Nausea, right upper quadrant/right flank pain, as well  as left upper quadrant pain.   HISTORY OF PRESENT ILLNESS:  The patient is a 45 year old female with  history of bipolar disorder, asthma, and attention deficit hyperactivity  disorder.  For about a week now she had been having intermittent right  upper quadrant as well as left upper quadrant pain.  She feels that it  is possibly worse with eating.  She was evaluated for this by her  primary care Bralen Wiltgen's office who obtained right upper quadrant that  did not show any cholecystitis or any evidence of cholelithiasis.  Since  pain continued she presented to the emergency department where a CT scan  was done that also did not show any abnormalities of the pancreas or  gallbladder.  She does have fatty liver infiltration and right renal  cyst of unclear etiology, but otherwise imaging is unremarkable.  She  does state that the pain is somewhat worse with eating.  She has a  little bit of nausea but no vomiting per se, just a lot of heartburn.  She also has been feeling somewhat lightheaded recently.  She denies  excessive alcohol abuse or drug abuse.   She denies any fevers but has had some chills.   Also the patient endorses about two weeks ago she had an episode of  suicidal ideation where she took too much Klonopin.  Since then she was  evaluated for this by psychiatry as an outpatient and currently denies  any suicidal ideation or homicidal ideation.   REVIEW OF SYSTEMS:  As above.  Otherwise unremarkable.   PAST MEDICAL HISTORY:  1. Significant for seasonal  allergies.  2. Asthma.  3. Attention deficit hyperactivity disorder.  4. Bipolar.  5. Post-traumatic stress disorder.  6. The patient also endorses that she always had tachycardia.   SOCIAL HISTORY:  The patient continues to smoke and currently uses  nicotine replacement.  The patient says she drinks about one drink per  month and has not been drinking anything recently.   FAMILY HISTORY:  Noncontributory.   ALLERGIES:  1. Allergic to CODEINE.  2. LAMICTAL.   MEDICATIONS:  1. Geodon 80 mg in the afternoon and 160 before bedtime.  2. Klonopin 2 mg at bedtime.  3. Zoloft 100 mg the morning.  4. Vyvanse 70 mg in the morning.  5. Xopenex as needed.  6. Tramadol.  7. Acetaminophen as needed for pain.  8. She was recently started on antibiotic, and only took a few days of      this.  9. Nasacort twice a day, inhaled   PHYSICAL EXAMINATION:  VITAL SIGNS:  Temperature 98.9, blood pressure  143/92, pulse 118, respirations 20, satting 95% on  room air.  GENERAL:  The patient appears to be in no acute distress.  HEENT:  Head nontraumatic.  Moist mucous membranes.  LUNGS:  Clear to auscultation bilaterally.  HEART:  Rapid but regular.  No murmurs appreciated.  ABDOMEN:  Soft, nontender, nondistended.  Abdomen is obese.  There is  slight upper quadrant tenderness and right flank tenderness.  EXTREMITIES:  Lower extremities without clubbing, cyanosis or edema.  NEUROLOGIC:  Appears to be intact.  SKIN:  Clean, dry and intact.   LABORATORY DATA:  White blood cell count 13.9, hemoglobin 14.5.  Sodium  141, potassium 3.6, creatinine 0.73.  Albumin 3.4, ALT slightly elevated  at 36.   Right upper quadrant ultrasound from July 23 showed no cholecystitis,  but right renal cyst.  CT scan from today showing normal pancreas, right  renal cyst, increased in size.  Lipase 110.   ASSESSMENT/PLAN:  This is a 45 year old female with abdominal  discomfort, mildly elevated lipase, normal pancreas  per CT scan, and no  epigastric tenderness.  1. Question pancreatitis.  I doubt that this is episode of severe      pancreatitis, given that her pain is atypical, as well as no      radiologic findings.  Consistent pancreatitis noted.  Would      observe.  For now make n.p.o.  Give IV fluids.  Recheck lipase and      see which way she trends.  See if her pain is any better with no      p.o. intake.  There is a small possibility of early pancreatitis.      Review of medications was done.  None was noted to significantly      contribute to pancreatitis.  Will also check fasting lipid panel      for triglycerides to see if that could be potential cause.  2. Right upper quadrant pain, etiology not quite clear.  CT scan did      show hepatic steatosis but no cholecystitis.  No pancreatitis was      noted on the CAT scan.  Will observe in room overnight and we will      also given the fact that.  Xanax has been known to cause increased      abdominal pain, will hold for now.  This was just recently started.  3. Right renal cyst.  Given it is increasing in size will obtain MRI      to evaluate for any possibility of renal carcinoma, although it is      much less likely.  It is hard to say if her pain is related to the      renal cyst or not.  4. Bipolar disorder.  Continue Geodon, Zoloft.  May need psych consult      to see what other options could be for her other psychiatric      medications.  5. Elevated white blood cell count, etiology unclear.  Will check UA.      Check x-ray, although the lower cuts from CT appears to not show      any evidence of pneumonia.  The patient did endorse some chills.  6. Prophylaxis.  Protonix plus SCDs.      Michiel Cowboy, MD  Electronically Signed     AVD/MEDQ  D:  07/07/2009  T:  07/07/2009  Job:  [id number]   cc:   Johnn Hai, M.D.
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
## 2011-04-28 NOTE — Assessment & Plan Note (Signed)
MEDICAL RECORD NUMBER:  [id number]   INTERVAL HISTORY:  Angelica [name] is a 45 year old single white female who is  being seen in our pain and rehabilitative clinic for chronic intermittent  low back pain.   At our last visit she was set up to get a TENS unit and undergo some  physical therapy.   She reports she has done this and she has been trained on some ergonomic  issues as well as she started some lumbar strengthening program and will be  transitioned to Pilate's type exercise.   She reports the TENS unit has been quite helpful.  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   Average pain is about 5/6 on a scale of 10, currently 4.  Pain is  intermittent and it varies in its nature, sometimes sharp, more symptoms,  more burning, sometimes goes down the left leg.   She gets good relief with her current meds.  She is sleeping well.  She  reports pain is worse with standing and bending and improves with rest and  therapy, TENS unit, medications.   She is able to walk about 30 minutes at a time four times a week.  She can  climb stairs.  She drives.  She is employed about 30 hours a week.  She is  independent with all of her self-care.   She reports intermittent bowel problems, depression, anxiety, also some  nausea, vomiting, diarrhea intermittently, abdominal pain, coughing and  wheezing.   No changes in past medical, social or family history since last visit  although she does mention she is having some of her psychiatric medications  changed a bit.  She attributes some of her anxiety due to the changes of  some of her meds.   PHYSICAL EXAMINATION:  Blood pressure currently is 132/65, pulse 113,  respirations 18, 96% saturated on room air.  She is a well-developed mildly  obese white female.  She is oriented x3.  Her affect is bright, alert,  cooperative and pleasant.  She is able to stand easily.  Gait in the room is  normal.  Heel-toe-walking is normal.  She has some increased pain with  forward flexion and more so with extension.  Reflexes are symmetric and  intact in the lower extremities.  She has no focal weakness in the lower  extremities.  No new sensory deficits.   IMPRESSION:  1.  Degenerative disk disease/facet arthropathy L4-5.  2.  Lumbago.  3.  Trochanteric bursitis.   PLAN:  Would like her to continue to follow through with her exercise  program.  We will add ibuprofen 600 mg about three to four times a week in  the afternoon p.r.n.  I would also like to eventually get her into an  aerobic conditioning program, possibly some diet education.  Overall she has  been complying well with our  recommendations.  She will continue to use the TENS unit on a p.r.n. basis  as well.  We will see her back in a month.           ______________________________  Brantley Stage, M.D.     DMK/MedQ  D:  08/16/2005 15:20:35  T:  08/16/2005 20:58:16  Job #:  [id number]   cc:   Dr. Julian Reil

## 2011-04-28 NOTE — Assessment & Plan Note (Signed)
Angelica [name] is a 45 year old divorced white female who is being seen in our  pain and rehabilitative clinic for chronic low back pain.   She is back into clinic today with complaints of bilateral flank pain, fever  and urinary frequency, which began fairly suddenly about two days ago.   She also has some chronic low back pain, which she has been seeing a  physical therapist for.  She has gone several times now and is learning  lumbar stabilization technique, proper body mechanics, and will soon be  addressing a generalized conditioning program with her over the next several  months.  Her back pain, although complicated possibly by a new etiology over  the last couple of days, is described as intermittent, sharp, burning,  stabbing.  The time of day her pain is worst is morning and evening.  The  pain is typically exacerbated with standing and activities of sitting,  improved with rest and therapy.   She is able to walk about 20 minutes at a time.  She is able to climb  stairs.  She does drive.  She works about 30 hours a week.  She works as an  Theatre stage manager.   REVIEW OF SYSTEMS:  Positive for fever, chills, diarrhea, abdominal pain,  urinary retention.  Also positive for tremor, trouble walking, spasm and  anxiety.   Denies suicidal ideation.   No new changes in past medical history other than described in the history  of present illness.  Smokes 2-1/2 packs of cigarettes a day.  Currently  lives alone.   PHYSICAL EXAMINATION TODAY:  VITAL SIGNS:  Blood pressure 147/72, pulse 102,  respirations 16, 98% saturated on room air.  GENERAL:  She is a well-developed, obese white female who does not appear in  distress today.  She is oriented x3.  Affect bright, alert, cooperative,  pleasant.  MUSCULOSKELETAL/NEUROLOGIC:  Gait is normal today.  She has some limitations  in lumbar range of motion especially with extension.  She has tenderness  with costovertebral angle  percussion bilaterally today.  Reflexes in the  lower extremities are 2+ at the knees, 2+ at the ankles.  No numbness is  noted with light touch.  Motor strength is 5/5.  Straight leg raise is  negative.   IMPRESSION:  1.  New problem, fever, flank pain and urinary frequency x2 days.  2.  Mild degenerative disk disease/facet arthropathy, L4-5.  3.  Lumbago.  4.  Bilateral trochanteric bursitis.  5.  Left neuralgia paresthetica.   PLAN:  Patient to follow up with PCP for flank pain, fever and urinary  frequency.  Will continue stabilization exercises for her lumbago.  Will get  her set up for a TENS unit.  Will add Ultram one to two tablets p.o. daily  as needed and will see her back in a month.     DMK/MedQ  D:  07/19/2005 15:38:18  T:  07/20/2005 06:39:21  Job #:  [id number]

## 2011-04-28 NOTE — Assessment & Plan Note (Signed)
MEDICAL RECORD NUMBER:  [id number]   DATE OF BIRTH:  07/05/1966   DATE OF SERVICE:  June 14, 2005   INTERVAL HISTORY:  Angelica [name] is a 45 year old divorced white female  referred by Dr. Jeral Fruit who is being seen in our pain and rehabilitative  clinic for the second time today.  She was last seen May 03, 2005.   Angelica [name] has history remarkable for degenerative disk disease and facet  arthropathy at L4-5, history of lumbago, bilateral trochanteric bursitis,  possible neuralgia paresthetica left thigh.   Since she was last seen May 24, she reports overall improvement in her pain.  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  Her average pain is between a  5 and a 6 and today it is down to 3.  She just returned from a trip to  [place].  Overall, her pain has been doing much better since she has not been  as busy with the art work.   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   Past medical history remarkable for recent abortion.   Patient is currently able to walk 30 minutes at a time.  She is able to  climb stairs.  She drives.  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   REVIEW OF SYSTEMS:  Positive for numbness, tingling, confusion, also  possibly blood sugar problems and these are being checked out by her family  doctor.   EXAM TODAY:  Blood pressure 151/88, recheck was 136/87, pulse 116,  respirations 18, 99% saturated on room air.  She is obese white female does  not appear in any distress during our interview, she is oriented x3, affect  is overall bright, alert, pleasant.  She is able to stand up without any  difficulty despite no pain behaviors in the room.  Gait is normal.  She has  fair range of motion in her lumbar spine.  Reflexes are symmetric,  in the  lower extremities intact below the knee, she has intact sensation, she  reports continued area of numbness over the left lateral thigh.  She has  tenderness at both trochanters worse on the right than on the left in an  area which is tender to palpation over the left sacroiliac joint area.   Medications at this time include the following:  Abilify, Seroquel, Zoloft,  Klonopin, Tegretol, albuterol, Flonase.   Urine drug screen negative for opiates and illegal substances.   IMPRESSION:  1.  Mild degenerative disk disease/facet arthropathy L4-5.  2.  Lumbago.  3.  Bilateral trochanteric bursitis.  4.  Left meralgia paresthetica.   PLAN:  We will have patient set up for physical therapy emphasis on postural  education, proper body mechanics, ergonomics, lumbar stabilization, we will  also have them address her bursitis bilaterally with iliotibial band  stretches, ultrasound, and have them review flareup protocols with her.  We  will see her back in a month.  May also  consider using Lidoderm as well as TENS unit with her in the future.  Overall, she is doing quite well at this point.  We will see her back in a  month.       DMK/MedQ  D:  06/14/2005 17:22:28  T:  06/14/2005 18:45:01  Job #:  [id number]   cc:  Hilda Lias, M.D.  [address]  [place], [place] [address]  Fax: [phone]   Schuyler Amor, M.D.  [address]  [address]  [place], [place] [address]  Fax: [phone]

## 2011-06-20 ENCOUNTER — Other Ambulatory Visit: Payer: Self-pay | Admitting: Family Medicine

## 2011-06-20 ENCOUNTER — Ambulatory Visit
Admission: RE | Admit: 2011-06-20 | Discharge: 2011-06-20 | Disposition: A | Discharge status: Home / Self Care | Payer: PRIVATE HEALTH INSURANCE | Source: Ambulatory Visit | Attending: Family Medicine | Admitting: Family Medicine

## 2011-06-20 DIAGNOSIS — R110 Nausea: Secondary | ICD-10-CM

## 2011-07-29 ENCOUNTER — Emergency Department (HOSPITAL_COMMUNITY)
Admission: EM | Admit: 2011-07-29 | Discharge: 2011-07-29 | Disposition: A | Discharge status: Home / Self Care | Payer: PRIVATE HEALTH INSURANCE | Attending: Emergency Medicine | Admitting: Emergency Medicine

## 2011-07-29 ENCOUNTER — Emergency Department (HOSPITAL_COMMUNITY): Payer: PRIVATE HEALTH INSURANCE

## 2011-07-29 DIAGNOSIS — Q619 Cystic kidney disease, unspecified: Secondary | ICD-10-CM | POA: Insufficient documentation

## 2011-07-29 DIAGNOSIS — R1031 Right lower quadrant pain: Secondary | ICD-10-CM | POA: Insufficient documentation

## 2011-07-29 LAB — CBC
HCT: 42.6 % (ref 36.0–46.0)
Hemoglobin: 14.4 g/dL (ref 12.0–15.0)
MCH: 31.2 pg (ref 26.0–34.0)
MCV: 92.4 fL (ref 78.0–100.0)
Platelets: 400 10*3/uL (ref 150–400)
RBC: 4.61 MIL/uL (ref 3.87–5.11)
WBC: 11 10*3/uL — ABNORMAL HIGH (ref 4.0–10.5)

## 2011-07-29 LAB — URINALYSIS, ROUTINE W REFLEX MICROSCOPIC
Glucose, UA: NEGATIVE mg/dL
Ketones, ur: NEGATIVE mg/dL
Leukocytes, UA: NEGATIVE
Protein, ur: NEGATIVE mg/dL
Urobilinogen, UA: 0.2 mg/dL (ref 0.0–1.0)

## 2011-07-29 LAB — COMPREHENSIVE METABOLIC PANEL
AST: 11 U/L (ref 0–37)
BUN: 8 mg/dL (ref 6–23)
CO2: 23 mEq/L (ref 19–32)
Calcium: 9.4 mg/dL (ref 8.4–10.5)
Chloride: 107 mEq/L (ref 96–112)
Creatinine, Ser: 0.78 mg/dL (ref 0.50–1.10)
GFR calc Af Amer: >60 mL/min (ref >60)
GFR calc non Af Amer: >60 mL/min (ref >60)
Glucose, Bld: 88 mg/dL (ref 70–99)
Total Bilirubin: 0.2 mg/dL — ABNORMAL LOW (ref 0.3–1.2)

## 2011-07-29 LAB — DIFFERENTIAL
Lymphocytes Relative: 27 % (ref 12–46)
Lymphs Abs: 3 10*3/uL (ref 0.7–4.0)
Monocytes Relative: 7 % (ref 3–12)
Neutro Abs: 7.1 10*3/uL (ref 1.7–7.7)
Neutrophils Relative %: 64 % (ref 43–77)

## 2011-07-29 LAB — PREGNANCY, URINE: Preg Test, Ur: NEGATIVE

## 2011-07-29 MED ORDER — IOHEXOL 300 MG/ML  SOLN
125.0000 mL | Freq: Once | INTRAMUSCULAR | Status: AC | PRN
  Administered 2011-07-29: 125 mL via INTRAVENOUS

## 2011-08-01 ENCOUNTER — Encounter: Payer: Self-pay | Admitting: Gynecology

## 2011-08-01 DIAGNOSIS — N870 Mild cervical dysplasia: Secondary | ICD-10-CM | POA: Insufficient documentation

## 2011-08-01 DIAGNOSIS — F319 Bipolar disorder, unspecified: Secondary | ICD-10-CM | POA: Insufficient documentation

## 2011-08-01 DIAGNOSIS — F3281 Premenstrual dysphoric disorder: Secondary | ICD-10-CM | POA: Insufficient documentation

## 2011-08-01 DIAGNOSIS — F1020 Alcohol dependence, uncomplicated: Secondary | ICD-10-CM | POA: Insufficient documentation

## 2011-08-01 DIAGNOSIS — J45909 Unspecified asthma, uncomplicated: Secondary | ICD-10-CM | POA: Insufficient documentation

## 2011-08-03 ENCOUNTER — Encounter: Payer: Self-pay | Admitting: Obstetrics and Gynecology

## 2011-08-03 ENCOUNTER — Ambulatory Visit (INDEPENDENT_AMBULATORY_CARE_PROVIDER_SITE_OTHER): Payer: PRIVATE HEALTH INSURANCE | Admitting: Obstetrics and Gynecology

## 2011-08-03 VITALS — BP 136/84

## 2011-08-03 DIAGNOSIS — N949 Unspecified condition associated with female genital organs and menstrual cycle: Secondary | ICD-10-CM

## 2011-08-03 DIAGNOSIS — R102 Pelvic and perineal pain: Secondary | ICD-10-CM

## 2011-08-03 NOTE — Patient Instructions (Signed)
Schedule pelvic ultrasound here. Keep appointment with urologist.

## 2011-08-03 NOTE — Progress Notes (Signed)
The patient came in today with a one-week history of pelvic pain. It started mostly periumbilically. She still has it there but also has radiated to right lower quadrant. She is having some nausea and some dizziness with it. She went to the emergency room on Saturday. CT scan of her abdomen and pelvis only revealed a renal lesion. She has an appointment to see a urologist within the next week. She did not have appendicitis on CT scan. It was done with contrast. She started her period a day after the pain started. Her period was much heavier than normal. She had a home pregnancy test which was positive. However a urine pregnancy test the emergency room was negative.  Abdomen: Her abdomen is soft without peritoneal signs. She does have some mild guarding both suprapubically and in the right lower quadrant. She does not have any rebound. Pelvic: Entire pelvic exam was entirely normal. There was some minimal tenderness. Examination of the uterus and adnexa is somewhat inaccurate because of patient's size.  Assessment: 1. Pelvic pain 2. Renal lesion  Plan: 1. CBC and qualitative hCG drawn 2. Pelvic ultrasound ordered 3. Urology consult already scheduled. Addendum: Please note the patient was in [place] and had a very bad fall. She was previously seen and had a CT scan of her head which was negative.

## 2011-08-04 ENCOUNTER — Ambulatory Visit (INDEPENDENT_AMBULATORY_CARE_PROVIDER_SITE_OTHER): Payer: PRIVATE HEALTH INSURANCE | Admitting: Obstetrics and Gynecology

## 2011-08-04 ENCOUNTER — Encounter: Payer: Self-pay | Admitting: Obstetrics and Gynecology

## 2011-08-04 ENCOUNTER — Other Ambulatory Visit: Payer: PRIVATE HEALTH INSURANCE

## 2011-08-04 DIAGNOSIS — R102 Pelvic and perineal pain: Secondary | ICD-10-CM

## 2011-08-04 DIAGNOSIS — Z803 Family history of malignant neoplasm of breast: Secondary | ICD-10-CM

## 2011-08-04 DIAGNOSIS — N949 Unspecified condition associated with female genital organs and menstrual cycle: Secondary | ICD-10-CM

## 2011-08-04 DIAGNOSIS — N946 Dysmenorrhea, unspecified: Secondary | ICD-10-CM

## 2011-08-04 DIAGNOSIS — R1031 Right lower quadrant pain: Secondary | ICD-10-CM

## 2011-08-04 NOTE — Progress Notes (Signed)
The patient came back today for her ultrasound. She continues to have pain but now it's more midepigastric. Her CBC was normal. Her qualitative hCG was negative. On ultrasound today she has a uterus with a small fibroid of 1.3 cm. Her endometrial echo is thin at 3.2 mm. Neither ovary has any disease. There is no cul-de-sac fluid.  Assessment: Abdominal pain of unknown etiology  Plan: In addition to see an urologist which her were to have scheduled she will see Angelica [name] who is her PCP. If the problem persists and no one has the answer she will return to discuss endometriosis and laparoscopy.

## 2011-08-15 ENCOUNTER — Encounter: Payer: Self-pay | Admitting: Obstetrics and Gynecology

## 2011-08-15 ENCOUNTER — Ambulatory Visit (INDEPENDENT_AMBULATORY_CARE_PROVIDER_SITE_OTHER): Payer: PRIVATE HEALTH INSURANCE | Admitting: Obstetrics and Gynecology

## 2011-08-15 DIAGNOSIS — N949 Unspecified condition associated with female genital organs and menstrual cycle: Secondary | ICD-10-CM

## 2011-08-15 DIAGNOSIS — R102 Pelvic and perineal pain: Secondary | ICD-10-CM

## 2011-08-15 NOTE — Progress Notes (Signed)
The patient came back to see me today because of her persistence of pelvic pain. She describes the pain today as being more in the midline and slightly below the umbilicus. It is definitely made worse after eating. She will have some diarrhea her as well. She saw the nurse practitioner at [facility] urology. She felt a cyst on her kidney which was seen on CT scan was actually smaller than when she last was seen at [facility]. She didn't think her pain was urological. The patient went back to see her PCP. She is being referred to the [facility] because of a assortment of immune disorders. Her PCP was concerned because her uterus was somewhat tender and slightly enlarged. Ultrasound here only revealed a small fibroid.  Abdomen is soft without guarding rebound or masses. Pelvic examination today reveals just minimal tenderness and no enlargement.  Assessment: Abdominal pain  Plan: When patient is at the [facility] which will be the beginning of October she will see a gynecologist along with her other physicians. They will get their recommendation regarding laparoscopy looking for endometriosis. I told her I think she needs to see a GI doctor as well. I also think she needs a cystoscopy to rule out interstitial cystitis and I told her that today. She will followup on all the above.

## 2011-09-19 LAB — POCT URINALYSIS DIP (DEVICE)
Bilirubin Urine: NEGATIVE
Hgb urine dipstick: NEGATIVE
Ketones, ur: NEGATIVE
pH: 6.5

## 2012-01-12 ENCOUNTER — Ambulatory Visit (INDEPENDENT_AMBULATORY_CARE_PROVIDER_SITE_OTHER): Payer: PRIVATE HEALTH INSURANCE | Admitting: Women's Health

## 2012-01-12 ENCOUNTER — Ambulatory Visit (INDEPENDENT_AMBULATORY_CARE_PROVIDER_SITE_OTHER): Payer: PRIVATE HEALTH INSURANCE

## 2012-01-12 DIAGNOSIS — N949 Unspecified condition associated with female genital organs and menstrual cycle: Secondary | ICD-10-CM

## 2012-01-12 DIAGNOSIS — R102 Pelvic and perineal pain: Secondary | ICD-10-CM

## 2012-01-12 LAB — URINALYSIS W MICROSCOPIC + REFLEX CULTURE
Ketones, ur: 15 mg/dL — AB
Nitrite: NEGATIVE
Specific Gravity, Urine: 1.03 (ref 1.005–1.030)
pH: 5.5 (ref 5.0–8.0)

## 2012-01-12 NOTE — Progress Notes (Signed)
Patient ID: Angelica [name], female   DOB: Feb 01, 1966, 45 y.o.   MRN: [id number] Presents with a complaint of lower right quadrant pain. States has had this pain for several days and is similar to an ovarian cyst pain that she has had in the past. Saw her primary care yesterday, had labs and ruled out appendicitis. Has seen her orthopedist due to back discomfort. Denies pain or burning with urination, but states urine is dark in color. Denies any discharge, not sexually active, having regular monthly cycle. Denies a fever, nausea or vomiting. States had a bowel movement yesterday but none today. Has been in a treatment center for an eating disorder in [place] and is making changes to her diet. Mother accompanied her to office visit. UA negative with ketones.  Exam: Abdomen obese and soft, tenderness in right lower quadrant with pressure, no rebound, generalized tenderness throughout abdomen. No pain in abdomen with jumping.  Ultrasound: single small fibroid noted 12th by 9 x 11 mm. Ovaries appear normal. No free fluid seen. Large loops of distended colon visible.  Constipation Eating disorder/mental health problems History of small fibroid, Normal ovaries  Plan: Urine culture pending. Reviewed importance of increasing fluids, decreasing caffeinated fluids. Continue treatment for eating disorder. Reviewed importance of increasing fiber rich foods to greater than 25 g per day. Samples of MiraLax given, will take one with dinner and full glass of water, repeat at bedtime and continue 3-4 times a day until regular bowel movements and abdominal pain decreases. Reviewed importance of diet, exercise/ daily walking with bowel health. Is scheduled to see her gastroenterologist for colonoscopy due to ongoing problems with stomach pain.

## 2012-01-15 ENCOUNTER — Other Ambulatory Visit: Payer: Self-pay | Admitting: *Deleted

## 2012-01-15 ENCOUNTER — Other Ambulatory Visit: Payer: Self-pay | Admitting: Obstetrics and Gynecology

## 2012-01-15 ENCOUNTER — Telehealth: Payer: Self-pay | Admitting: Women's Health

## 2012-01-15 DIAGNOSIS — N390 Urinary tract infection, site not specified: Secondary | ICD-10-CM

## 2012-01-15 DIAGNOSIS — R102 Pelvic and perineal pain: Secondary | ICD-10-CM

## 2012-01-15 LAB — URINE CULTURE: Colony Count: >=100000

## 2012-01-15 MED ORDER — NITROFURANTOIN MONOHYD MACRO 100 MG PO CAPS: 100.0000 mg | ORAL_CAPSULE | Freq: Two times a day (BID) | ORAL | Status: AC

## 2012-01-15 MED ORDER — CIPROFLOXACIN HCL 500 MG PO TABS: 500.0000 mg | ORAL_TABLET | Freq: Two times a day (BID) | ORAL | Status: AC

## 2012-01-15 MED ORDER — FLUCONAZOLE 150 MG PO TABS: 150.0000 mg | ORAL_TABLET | Freq: Once | ORAL | Status: AC

## 2012-01-15 NOTE — Telephone Encounter (Signed)
Reviewed need to change antibiotic for her UTI due to sensitivity results. Will not take Cipro, will take Macrobid and check test of cure UA in 2 weeks.

## 2012-03-18 ENCOUNTER — Other Ambulatory Visit: Payer: Self-pay | Admitting: *Deleted

## 2012-03-18 DIAGNOSIS — G4733 Obstructive sleep apnea (adult) (pediatric): Secondary | ICD-10-CM

## 2012-03-18 DIAGNOSIS — G3184 Mild cognitive impairment, so stated: Secondary | ICD-10-CM

## 2012-03-18 DIAGNOSIS — R209 Unspecified disturbances of skin sensation: Secondary | ICD-10-CM

## 2012-03-22 ENCOUNTER — Ambulatory Visit
Admission: RE | Admit: 2012-03-22 | Discharge: 2012-03-22 | Disposition: A | Discharge status: Home / Self Care | Payer: PRIVATE HEALTH INSURANCE | Source: Ambulatory Visit | Attending: *Deleted | Admitting: *Deleted

## 2012-03-22 DIAGNOSIS — G4733 Obstructive sleep apnea (adult) (pediatric): Secondary | ICD-10-CM

## 2012-03-22 DIAGNOSIS — G3184 Mild cognitive impairment, so stated: Secondary | ICD-10-CM

## 2012-03-22 DIAGNOSIS — R209 Unspecified disturbances of skin sensation: Secondary | ICD-10-CM

## 2012-06-21 ENCOUNTER — Encounter: Payer: Self-pay | Admitting: Gynecology

## 2012-06-21 ENCOUNTER — Ambulatory Visit: Payer: PRIVATE HEALTH INSURANCE | Admitting: Gynecology

## 2012-06-21 ENCOUNTER — Ambulatory Visit (INDEPENDENT_AMBULATORY_CARE_PROVIDER_SITE_OTHER): Payer: PRIVATE HEALTH INSURANCE | Admitting: Gynecology

## 2012-06-21 VITALS — BP 124/84

## 2012-06-21 DIAGNOSIS — N949 Unspecified condition associated with female genital organs and menstrual cycle: Secondary | ICD-10-CM

## 2012-06-21 DIAGNOSIS — N946 Dysmenorrhea, unspecified: Secondary | ICD-10-CM | POA: Insufficient documentation

## 2012-06-21 DIAGNOSIS — N920 Excessive and frequent menstruation with regular cycle: Secondary | ICD-10-CM

## 2012-06-21 DIAGNOSIS — R102 Pelvic and perineal pain: Secondary | ICD-10-CM | POA: Insufficient documentation

## 2012-06-21 MED ORDER — KETOROLAC TROMETHAMINE 10 MG PO TABS: 10.0000 mg | ORAL_TABLET | Freq: Four times a day (QID) | ORAL | Status: AC | PRN

## 2012-06-21 MED ORDER — KETOROLAC TROMETHAMINE 30 MG/ML IJ SOLN
30.0000 mg | Freq: Once | INTRAMUSCULAR | Status: AC
  Administered 2012-06-21: 30 mg via INTRAMUSCULAR

## 2012-06-21 NOTE — Patient Instructions (Addendum)
MenorrhagiaDysmenorrhea Menstrual pain is caused by the muscles of the uterus tightening (contracting) during a menstrual period. The muscles of the uterus contract due to the chemicals in the uterine lining. Primary dysmenorrhea is menstrual cramps that last a couple of days when you start having menstrual periods or soon after. This often begins after a teenager starts having her period. As a woman gets older or has a baby, the cramps will usually lesson or disappear. Secondary dysmenorrhea begins later in life, lasts longer, and the pain may be stronger than primary dysmenorrhea. The pain may start before the period and last a few days after the period. This type of dysmenorrhea is usually caused by an underlying problem such as: The tissue lining the uterus grows outside of the uterus in other areas of the body (endometriosis).  The endometrial tissue, which normally lines the uterus, is found in or grows into the muscular walls of the uterus (adenomyosis).  The pelvic blood vessels are engorged with blood just before the menstrual period (pelvic congestive syndrome).  Overgrowth of cells in the lining of the uterus or cervix (polyps of the uterus or cervix).  Falling down of the uterus (prolapse) because of loose or stretched ligaments.  Depression.  Bladder problems, infection, or inflammation.  Problems with the intestine, a tumor, or irritable bowel syndrome.  Cancer of the female organs or bladder.  A severely tipped uterus.  A very tight opening or closed cervix.  Noncancerous tumors of the uterus (fibroids).  Pelvic inflammatory disease (PID).  Pelvic scarring (adhesions) from a previous surgery.  Ovarian cyst.  An intrauterine device (IUD) used for birth control.  CAUSES  The cause of menstrual pain is often unknown. SYMPTOMS  Cramping or throbbing pain in your lower abdomen.  Sometimes, a woman may also experience headaches.  Lower back pain.  Feeling sick to your stomach  (nausea) or vomiting.  Diarrhea.  Sweating or dizziness.  DIAGNOSIS  A diagnosis is based on your history, symptoms, physical examination, diagnostic tests, or procedures. Diagnostic tests or procedures may include: Blood tests.  An ultrasound.  An examination of the lining of the uterus (dilation and curettage, D&C).  An examination inside your abdomen or pelvis with a scope (laparoscopy).  X-rays.  CT Scan.  MRI.  An examination inside the bladder with a scope (cystoscopy).  An examination inside the intestine or stomach with a scope (colonoscopy, gastroscopy).  TREATMENT  Treatment depends on the cause of the dysmenorrhea. Treatment may include: Pain medicine prescribed by your caregiver.  Birth control pills.  Hormone replacement therapy.  Nonsteroidal anti-inflammatory drugs (NSAIDs). These may help stop the production of prostaglandins.  An IUD with progesterone hormone in it.  Acupuncture.  Surgery to remove adhesions, endometriosis, ovarian cyst, or fibroids.  Removal of the uterus (hysterectomy).  Progesterone shots to stop the menstrual period.  Cutting the nerves on the sacrum that go to the female organs (presacral neurectomy).  Electric currant to the sacral nerves (sacral nerve stimulation).  Antidepressant medicine.  Psychiatric therapy, counseling, or group therapy.  Exercise and physical therapy.  Meditation and yoga therapy.  HOME CARE INSTRUCTIONS  Only take over-the-counter or prescription medicines for pain, discomfort, or fever as directed by your caregiver.  Place a heating pad or hot water bottle on your lower back or abdomen. Do not sleep with the heating pad.  Use aerobic exercises, walking, swimming, biking, and other exercises to help lessen the cramping.  Massage to the lower back or abdomen may  help.  Stop smoking.  Avoid alcohol and caffeine.  Yoga, meditation, or acupuncture may help.  SEEK MEDICAL CARE IF:  The pain does not get better with  medicine.  You have pain with sexual intercourse.  SEEK IMMEDIATE MEDICAL CARE IF:  Your pain increases and is not controlled with medicines.  You have a fever.  You develop nausea or vomiting with your period not controlled with medicine.  You have abnormal vaginal bleeding with your period.  You pass out.  MAKE SURE YOU:  Understand these instructions.  Will watch your condition.  Will get help right away if you are not doing well or get worse.  Document Released: 11/27/2005 Document Revised: 11/16/2011 Document Reviewed: 03/15/2009 [facility] Patient Information 2012 [place], [place]. Dysfunctional uterine bleeding is different from a normal menstrual period. When periods are heavy or there is more bleeding than is usual for you, it is called menorrhagia. It may be caused by hormonal imbalance, or physical, metabolic, or other problems. Examination is necessary in order that your caregiver may treat treatable causes. If this is a continuing problem, a D&C may be needed. That means that the cervix (the opening of the uterus or womb) is dilated (stretched larger) and the lining of the uterus is scraped out. The tissue scraped out is then examined under a microscope by a specialist (pathologist) to make sure there is nothing of concern that needs further or more extensive treatment. HOME CARE INSTRUCTIONS   If medications were prescribed, take exactly as directed. Do not change or switch medications without consulting your caregiver.   Long term heavy bleeding may result in iron deficiency. Your caregiver may have prescribed iron pills. They help replace the iron your body lost from heavy bleeding. Take exactly as directed. Iron may cause constipation. If this becomes a problem, increase the bran, fruits, and roughage in your diet.   Do not take aspirin or medicines that contain aspirin one week before or during your menstrual period. Aspirin may make the bleeding worse.   If you need to change  your sanitary pad or tampon more than once every 2 hours, stay in bed and rest as much as possible until the bleeding stops.   Eat well-balanced meals. Eat foods high in iron. Examples are leafy green vegetables, meat, liver, eggs, and whole grain breads and cereals. Do not try to lose weight until the abnormal bleeding has stopped and your blood iron level is back to normal.  SEEK MEDICAL CARE IF:   You need to change your sanitary pad or tampon more than once an hour.   You develop nausea (feeling sick to your stomach) and vomiting, dizziness, or diarrhea while you are taking your medicine.   You have any problems that may be related to the medicine you are taking.  SEEK IMMEDIATE MEDICAL CARE IF:   You have a fever.   You develop chills.   You develop severe bleeding or start to pass blood clots.   You feel dizzy or faint.  MAKE SURE YOU:   Understand these instructions.   Will watch your condition.   Will get help right away if you are not doing well or get worse.  Document Released: 11/27/2005 Document Revised: 11/16/2011 Document Reviewed: 07/17/2008 [facility] Patient Information 2012 [place], [place].

## 2012-06-21 NOTE — Progress Notes (Signed)
Patient is a 46 year old not sexually active since November 2012 presented to the office today complaining of 4-5 month history of severe dysmenorrhea and worsening menorrhagia. She has been seen in the past with similar complaints. She had an evaluation by the urologist as well. There and the process of evaluating interstitial cystitis. Her symptoms only attributed during time of her menses. Patient is a smoker and is overweight and suffers from bipolar disorder. She states her cycles are otherwise regular. She is taking tramadol and Flexeril. She has past history of alcohol abuse. She is allergic to codeine. She had been evaluated the [facility] in a determined at one point that her epigastric discomfort was attributed esophagitis? 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  Exam: Abdomen: Soft nontender no rebound or guarding pendulous Pelvic: Bartholin urethra Skene was within normal limits Vagina: Menstrual blood present Cervix: Menstrual blood present Uterus: Difficult to assess due to patient's abdominal girth Adnexa: Ptosis assess due to patient's abdominal girth Rectal: Not examined  Assessment/plan: Patient with dysmenorrhea and worsening menorrhagia possibly underlying adenomyosis. She will be given Toradol 60 mg IM now and prescription for by mouth Toradol 10 mg 1 by mouth every 6 hours for the next 4-5 days when necessary. She will return back to the office next week for a sonohysterogram due  to the fact that she's she has had intermenstrual spotting a few times in the past. We'll determined at that point on doing an endometrial biopsy as well.

## 2012-06-24 ENCOUNTER — Other Ambulatory Visit: Payer: Self-pay | Admitting: Gynecology

## 2012-06-24 ENCOUNTER — Ambulatory Visit: Payer: PRIVATE HEALTH INSURANCE | Admitting: Women's Health

## 2012-06-24 ENCOUNTER — Telehealth: Payer: Self-pay | Admitting: Gynecology

## 2012-06-24 DIAGNOSIS — N920 Excessive and frequent menstruation with regular cycle: Secondary | ICD-10-CM

## 2012-06-24 DIAGNOSIS — N946 Dysmenorrhea, unspecified: Secondary | ICD-10-CM

## 2012-06-24 NOTE — Telephone Encounter (Signed)
On Call Note 06/23/12.  Evaluated by Dr Glenetta Hew per previous note.  Reports pain better, but still present.  Toradol not effective.  No fevers, N,V,D. Some constipation.  Eating/drinking OK.  Recommend discontinue Toradol.  Ibuprofen 600-800 mg Q 6-8 hours, OV in AM if continues.  Ultrasound appt as scheduled if resolves.

## 2012-07-03 ENCOUNTER — Ambulatory Visit (INDEPENDENT_AMBULATORY_CARE_PROVIDER_SITE_OTHER): Payer: PRIVATE HEALTH INSURANCE

## 2012-07-03 ENCOUNTER — Ambulatory Visit (INDEPENDENT_AMBULATORY_CARE_PROVIDER_SITE_OTHER): Payer: PRIVATE HEALTH INSURANCE | Admitting: Gynecology

## 2012-07-03 ENCOUNTER — Other Ambulatory Visit: Payer: Self-pay | Admitting: Gynecology

## 2012-07-03 DIAGNOSIS — N920 Excessive and frequent menstruation with regular cycle: Secondary | ICD-10-CM

## 2012-07-03 DIAGNOSIS — D259 Leiomyoma of uterus, unspecified: Secondary | ICD-10-CM

## 2012-07-03 DIAGNOSIS — N946 Dysmenorrhea, unspecified: Secondary | ICD-10-CM

## 2012-07-03 DIAGNOSIS — N831 Corpus luteum cyst of ovary, unspecified side: Secondary | ICD-10-CM

## 2012-07-03 DIAGNOSIS — D251 Intramural leiomyoma of uterus: Secondary | ICD-10-CM

## 2012-07-03 NOTE — Progress Notes (Signed)
Patient is a 46 year old not sexually active since November 2012 presented to the office on July 12 complaining of 4-5 month history of severe dysmenorrhea and worsening menorrhagia. She presented today for sonohysterogram and further evaluation. She is using condoms for contraception when she sexually active. Her cycles are reported to be regular every 28 day otherwise.   Ultrasound: Uterus measures 7.2 x 4.5 x 3.5 cm with endometrial stripe of 9.0 mm patient on day 13 of her cycle. She had one small fibroid measured 11 mm. Right ovary was normal with follicles less than 10 mm noted. Left ovary thinwall echo-free measuring 26 x 60 mm internal low level echoes possibly corpus luteum cyst. Negative color flow.  Sonohysterogram no intracavitary defect  Assessment/plan: Patient dysmenorrhea and menorrhagia.  Patient is a smoker and is overweight and suffers from bipolar disorder. She states her cycles are otherwise regular. She is taking tramadol and Flexeril. She has past history of alcohol abuse. She is allergic to codeine. She had been evaluated the [facility] in a determined at one point that her epigastric discomfort was attributed esophagitis? I offered her a Mirena IUD that would help with her bleeding and cramping. I would not recommend oral contraceptive pill since she is 46 years of age and smokes. Literature information will be provided and we'll schedule accordingly.

## 2012-07-04 ENCOUNTER — Other Ambulatory Visit: Payer: Self-pay | Admitting: Gynecology

## 2012-07-04 ENCOUNTER — Telehealth: Payer: Self-pay | Admitting: Gynecology

## 2012-07-04 DIAGNOSIS — Z3049 Encounter for surveillance of other contraceptives: Secondary | ICD-10-CM

## 2012-07-04 MED ORDER — LEVONORGESTREL 20 MCG/24HR IU IUD: INTRAUTERINE_SYSTEM | Freq: Once | INTRAUTERINE | Status: DC

## 2012-07-04 NOTE — Telephone Encounter (Signed)
I spoke with Jan at [facility]. Patient has met her $5000 deductible. IUD and insertion applies to ded and since it is met ins is paying at 100%.  Patient informed.

## 2012-10-03 ENCOUNTER — Ambulatory Visit (INDEPENDENT_AMBULATORY_CARE_PROVIDER_SITE_OTHER): Payer: PRIVATE HEALTH INSURANCE | Admitting: Women's Health

## 2012-10-03 ENCOUNTER — Encounter: Payer: Self-pay | Admitting: Women's Health

## 2012-10-03 DIAGNOSIS — R102 Pelvic and perineal pain: Secondary | ICD-10-CM

## 2012-10-03 DIAGNOSIS — N898 Other specified noninflammatory disorders of vagina: Secondary | ICD-10-CM

## 2012-10-03 DIAGNOSIS — N949 Unspecified condition associated with female genital organs and menstrual cycle: Secondary | ICD-10-CM

## 2012-10-03 DIAGNOSIS — B49 Unspecified mycosis: Secondary | ICD-10-CM

## 2012-10-03 DIAGNOSIS — B379 Candidiasis, unspecified: Secondary | ICD-10-CM

## 2012-10-03 LAB — WET PREP FOR TRICH, YEAST, CLUE

## 2012-10-03 MED ORDER — TERCONAZOLE 0.8 % VA CREA: 1.0000 | TOPICAL_CREAM | Freq: Every day | VAGINAL | Status: DC

## 2012-10-03 NOTE — Progress Notes (Addendum)
Patient ID: Angelica [name], female   DOB: [birth year], 46 y.o.   MRN: [id number] Presents with several complaints. States her breasts have been tender like before cycle would start or if pregnant. Discomfort is diffuse, bilateral since taking Plan B emergency contraception. Occasional low abdominal cramping, increased vaginal discharge with itching for several days. Took plan B/ morning after pill on 09/28/12 after condom failure on 09/26/2012. States is worried about a possible pregnancy, has not been sexually active for one year. States has had problems with her bipolar disease, on numerous medications and has scheduled followup with psychiatrist. Had been on antibiotics several weeks ago for tonsillectomy. Has had problems with diarrhea and IBS since surgery. LMP 09/13/12  Exam: External genitalia within normal limits, speculum exam scant  white discharge with slight erythema.Marland Kitchen GC/Chlamydia culture taken, wet prep positive for yeast. Bimanual slight tenderness throughout exam no adnexal fullness or tenderness, abdomen obese.  Yeast STD screen Bipolar disease  Plan: Terazol 3 one applicator at bedtime x3 prescription, proper use given and reviewed. Reviewed unlikely pregnancy occurred, hCG qualitative pending. Instructed to call if no cycle within the month or if breast tenderness continuous.. Reviewed ultrasound if continued problems with cramping to rule out ovarian problem. Instructed to followup with primary care for GI symptoms and psychiatrist for counseling and medication adjustments.

## 2012-10-03 NOTE — Patient Instructions (Addendum)
Monilial Vaginitis Vaginitis in a soreness, swelling and redness (inflammation) of the vagina and vulva. Monilial vaginitis is not a sexually transmitted infection. CAUSES  Yeast vaginitis is caused by yeast (candida) that is normally found in your vagina. With a yeast infection, the candida has overgrown in number to a point that upsets the chemical balance. SYMPTOMS   White, thick vaginal discharge.  Swelling, itching, redness and irritation of the vagina and possibly the lips of the vagina (vulva).  Burning or painful urination.  Painful intercourse. DIAGNOSIS  Things that may contribute to monilial vaginitis are:  Postmenopausal and virginal states.  Pregnancy.  Infections.  Being tired, sick or stressed, especially if you had monilial vaginitis in the past.  Diabetes. Good control will help lower the chance.  Birth control pills.  Tight fitting garments.  Using bubble bath, feminine sprays, douches or deodorant tampons.  Taking certain medications that kill germs (antibiotics).  Sporadic recurrence can occur if you become ill. TREATMENT  Your caregiver will give you medication.  There are several kinds of anti monilial vaginal creams and suppositories specific for monilial vaginitis. For recurrent yeast infections, use a suppository or cream in the vagina 2 times a week, or as directed.  Anti-monilial or steroid cream for the itching or irritation of the vulva may also be used. Get your caregiver's permission.  Painting the vagina with methylene blue solution may help if the monilial cream does not work.  Eating yogurt may help prevent monilial vaginitis. HOME CARE INSTRUCTIONS   Finish all medication as prescribed.  Do not have sex until treatment is completed or after your caregiver tells you it is okay.  Take warm sitz baths.  Do not douche.  Do not use tampons, especially scented ones.  Wear cotton underwear.  Avoid tight pants and panty  hose.  Tell your sexual partner that you have a yeast infection. They should go to their caregiver if they have symptoms such as mild rash or itching.  Your sexual partner should be treated as well if your infection is difficult to eliminate.  Practice safer sex. Use condoms.  Some vaginal medications cause latex condoms to fail. Vaginal medications that harm condoms are:  Cleocin cream.  Butoconazole (Femstat).  Terconazole (Terazol) vaginal suppository.  Miconazole (Monistat) (may be purchased over the counter). SEEK MEDICAL CARE IF:   You have a temperature by mouth above 102 F (38.9 C).  The infection is getting worse after 2 days of treatment.  The infection is not getting better after 3 days of treatment.  You develop blisters in or around your vagina.  You develop vaginal bleeding, and it is not your menstrual period.  You have pain when you urinate.  You develop intestinal problems.  You have pain with sexual intercourse. Document Released: 09/06/2005 Document Revised: 02/19/2012 Document Reviewed: 05/21/2009 [employer] Patient Information 2013 [employer].  

## 2012-10-04 LAB — GC/CHLAMYDIA PROBE AMP, GENITAL
Chlamydia, DNA Probe: NEGATIVE
GC Probe Amp, Genital: NEGATIVE

## 2012-10-07 ENCOUNTER — Telehealth: Payer: Self-pay | Admitting: *Deleted

## 2012-10-07 NOTE — Telephone Encounter (Signed)
Pt said no cycle yet due to taking plan B pill, negative serum level at OV 10/03/12. Pt will wait until next week to call if no cycle or if breast tenderness and cramping occurs.

## 2012-10-10 ENCOUNTER — Encounter: Payer: PRIVATE HEALTH INSURANCE | Admitting: Women's Health

## 2012-10-18 ENCOUNTER — Encounter: Payer: PRIVATE HEALTH INSURANCE | Admitting: Women's Health

## 2012-10-21 ENCOUNTER — Other Ambulatory Visit (HOSPITAL_COMMUNITY)
Admission: RE | Admit: 2012-10-21 | Discharge: 2012-10-21 | Disposition: A | Discharge status: Home / Self Care | Payer: PRIVATE HEALTH INSURANCE | Source: Ambulatory Visit | Attending: Women's Health | Admitting: Women's Health

## 2012-10-21 ENCOUNTER — Encounter: Payer: Self-pay | Admitting: Women's Health

## 2012-10-21 ENCOUNTER — Ambulatory Visit (INDEPENDENT_AMBULATORY_CARE_PROVIDER_SITE_OTHER): Payer: PRIVATE HEALTH INSURANCE | Admitting: Women's Health

## 2012-10-21 VITALS — BP 128/98 | Ht 65.75 in | Wt 225.0 lb

## 2012-10-21 DIAGNOSIS — R7989 Other specified abnormal findings of blood chemistry: Secondary | ICD-10-CM

## 2012-10-21 DIAGNOSIS — Z01419 Encounter for gynecological examination (general) (routine) without abnormal findings: Secondary | ICD-10-CM

## 2012-10-21 DIAGNOSIS — E349 Endocrine disorder, unspecified: Secondary | ICD-10-CM

## 2012-10-21 DIAGNOSIS — R102 Pelvic and perineal pain: Secondary | ICD-10-CM

## 2012-10-21 DIAGNOSIS — Z1151 Encounter for screening for human papillomavirus (HPV): Secondary | ICD-10-CM | POA: Insufficient documentation

## 2012-10-21 DIAGNOSIS — N949 Unspecified condition associated with female genital organs and menstrual cycle: Secondary | ICD-10-CM

## 2012-10-21 NOTE — Progress Notes (Signed)
Angelica [name] 1966-02-01 [id number]    History:    The patient presents for annual exam.  Monthly cycle for 6-7 days with occasional menorrhagia with dysmenorrhea. 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  Reports normal mammogram one year ago while at [facility]. Psychiatrist manages severe bipolar disease medications and counseling. History of alcoholism, eating disorder with binging. Down 25 pounds from 2 years ago. Smoker 1-1/2 packs per day. History of breast cancer in mother and PGM, both after menopause. History of asthma. History of spontaneous galactorrhea with normal CT of brain 2012. Sonohysterogram July 2013 anteverted uterus with anterior fibroid 11 mm. Right ovary normal left ovary thin-walled echo-free 26 x 60 mm cyst, no defects seen.  Past medical history, past surgical history, family history and social history were all reviewed and documented in the EPIC chart. 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 Appears to have good family support.   ROS:  A  ROS was performed and pertinent positives and negatives are included in the history.  Exam:  Filed Vitals:   10/21/12 1534  BP: 128/98    General appearance:  Normal Head/Neck:  Normal, without cervical or supraclavicular adenopathy. Thyroid:  Symmetrical, normal in size, without palpable masses or nodularity. Respiratory  Effort:  Normal  Auscultation:  Clear without wheezing or rhonchi Cardiovascular  Auscultation:  Regular rate, without rubs, murmurs or gallops  Edema/varicosities:  Not grossly evident Abdominal  Soft,nontender, without masses, guarding or rebound.  Liver/spleen:  No organomegaly noted  Hernia:  None appreciated  Skin  Inspection:  Grossly normal  Palpation:  Grossly normal Neurologic/psychiatric  Orientation:  Normal with appropriate conversation.  Mood/affect:  Normal  Genitourinary    Breasts: Examined lying and sitting.     Right: Without  masses, retractions, discharge or axillary adenopathy.     Left: Without masses, retractions, discharge or axillary adenopathy.   Inguinal/mons:  Normal without inguinal adenopathy  External genitalia:  Normal  BUS/Urethra/Skene's glands:  Normal  Bladder:  Normal  Vagina:  Normal  Cervix:  Normal  Uterus:   normal in size, shape and contour.  Midline and mobile  Adnexa/parametria:     Rt: Without masses or tenderness.   Lt: Without masses or tenderness.  Anus and perineum: Normal  Digital rectal exam: Normal sphincter tone without palpated masses or tenderness  Assessment/Plan:  46 y.o. S. WF G2 P0 for annual exam with complaint of dysmenorrhea, occasional menorrhagia, intermittent pelvic cramping.  Severe bipolar disease-psychiatrist meds and counseling Occasional galactorrhea-questionable medication related Dysmenorrhea/menorrhagia history of small fibroid Contraception counseling Smoking Primary care labs  Plan: SBE's, instructed to schedule mammogram and reviewed importance of an annual screen especially with family history. Breast center number given. Aware of hazards of smoking and is planning to attend a inpatient facility to quit smoking. Encouraged regular daily walking, calcium rich foods, alternate vitamin daily, condoms if become sexually active. Contraception options reviewed blood pressure was elevated today/smoker reviewed best not to use a combination pill, Mirena IUD was encouraged. Is aware to schedule with Dr. Eda Paschal with a cycle for placement, continue abstinence until placed. Had labs at primary care with an elevated white count last week. Has seen a GI Dr. for IBS type symptoms will keep scheduled followup. Prolactin, UA, Pap, normal Pap 2010.   Harrington Challenger [facility], 5:06 PM 10/21/2012

## 2012-10-21 NOTE — Patient Instructions (Addendum)

## 2012-10-22 LAB — PROLACTIN: Prolactin: 26.1 ng/mL

## 2012-10-22 LAB — URINALYSIS W MICROSCOPIC + REFLEX CULTURE
Bacteria, UA: NONE SEEN
Bilirubin Urine: NEGATIVE
Glucose, UA: NEGATIVE mg/dL
Hgb urine dipstick: NEGATIVE
Protein, ur: NEGATIVE mg/dL
Urobilinogen, UA: 0.2 mg/dL (ref 0.0–1.0)

## 2012-10-23 LAB — URINE CULTURE: Colony Count: NO GROWTH

## 2013-01-25 ENCOUNTER — Other Ambulatory Visit: Payer: Self-pay

## 2013-05-15 IMAGING — CT CT HEAD W/O CM
2 series · 16 of 30 positions shown, 18 images · non-contrast
Comparison: Head CT 04/08/2007.

CLINICAL DATA: Head injury with nausea.

CT HEAD WITHOUT CONTRAST
TECHNIQUE: Contiguous axial images were obtained from the base of
the skull through the vertex without contrast.

[Series 2: head w/o · axial · non-contrast · 0.49mm/px · z∈[+75,+186]mm · 8 of 28 slices shown, 10 images]
[im 4/28  brain]
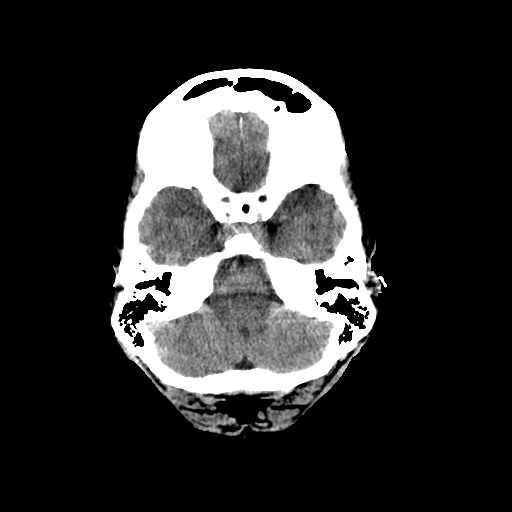
[im 4/28  bone]
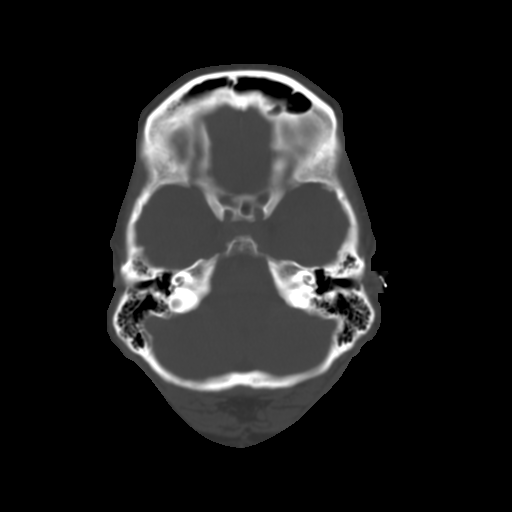
[im 7/28  brain]
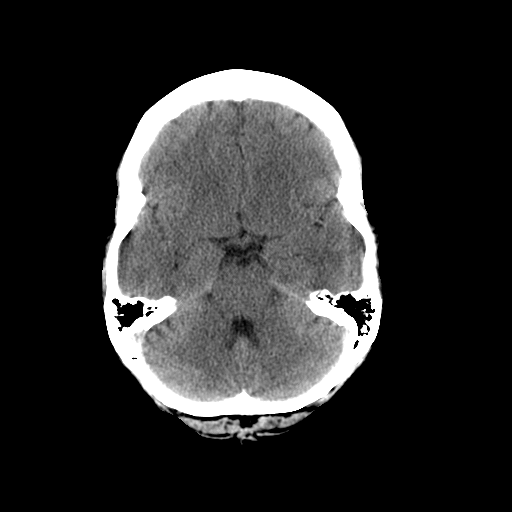
[im 10/28  brain]
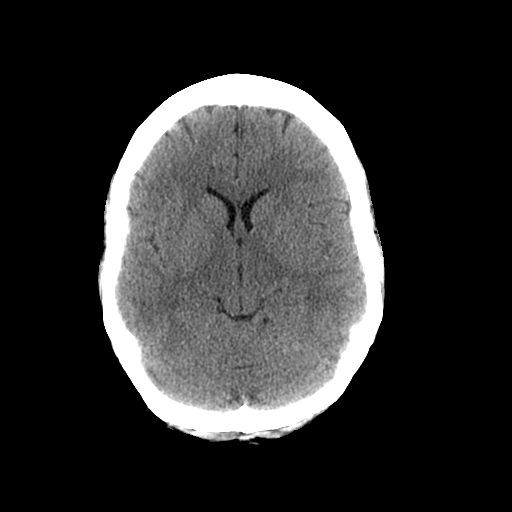
[im 13/28  brain]
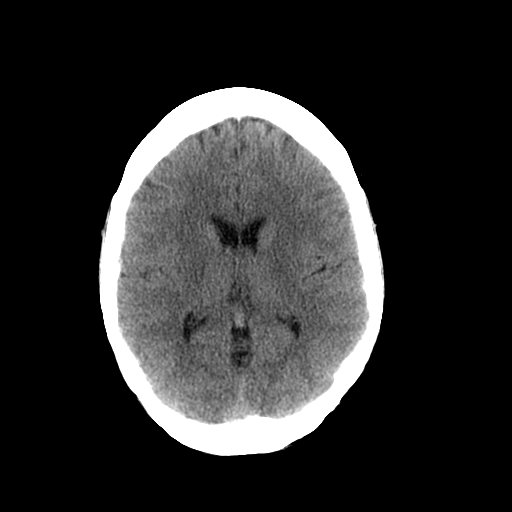
[im 16/28  brain]
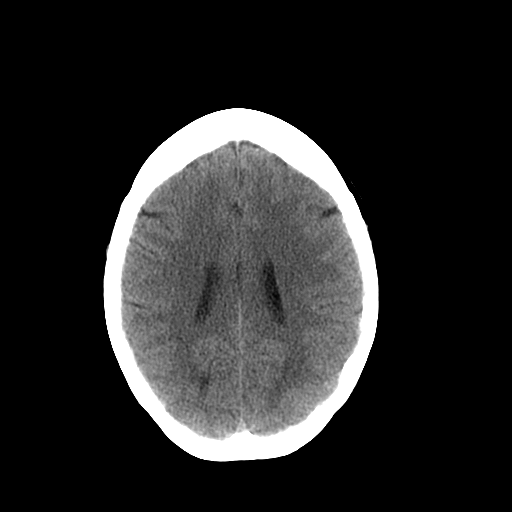
[im 16/28  bone]
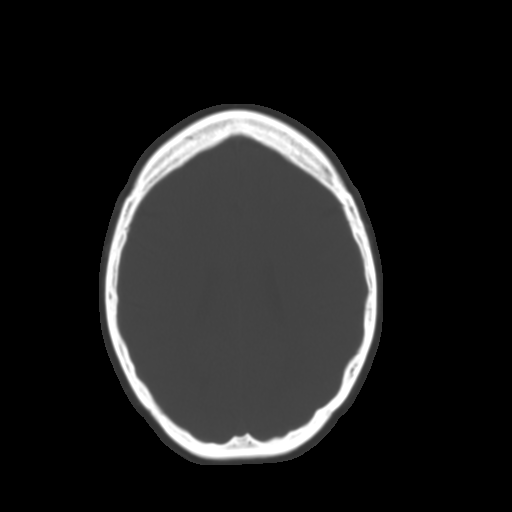
[im 19/28  brain]
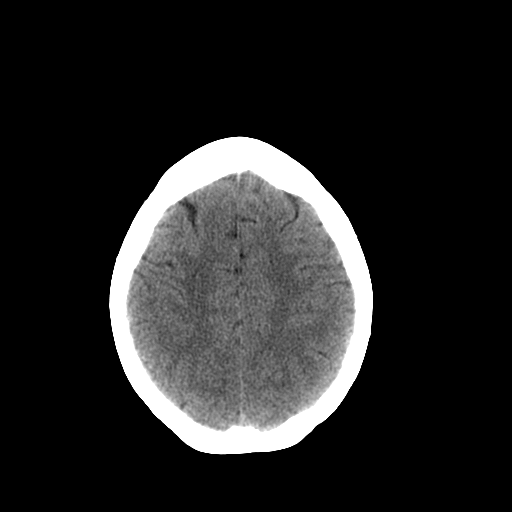
[im 22/28  brain]
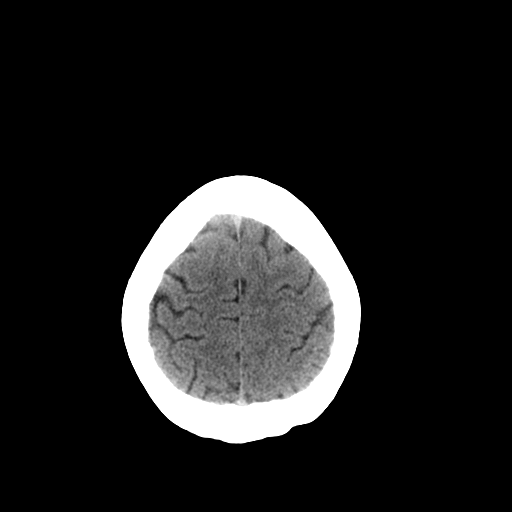
[im 25/28  brain]
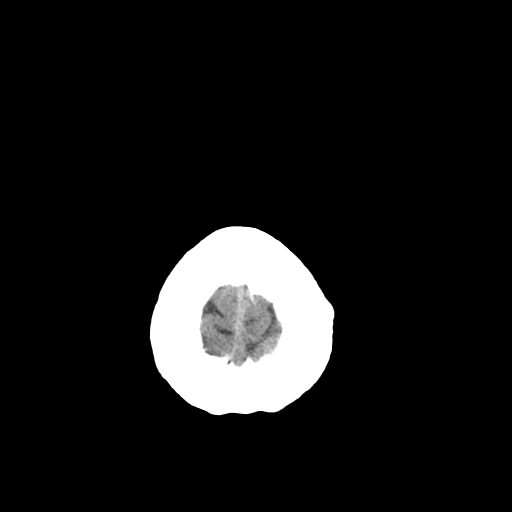

[Series 3: head bone · axial · 0.49mm/px · z∈[+71,+187]mm · 8 of 56 slices shown]
[im 6/56  bone]
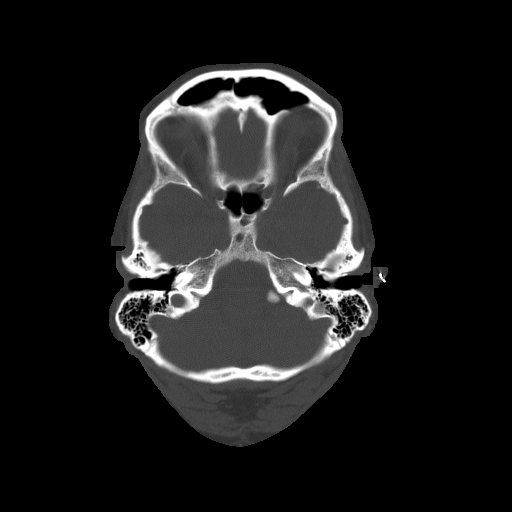
[im 12/56  bone]
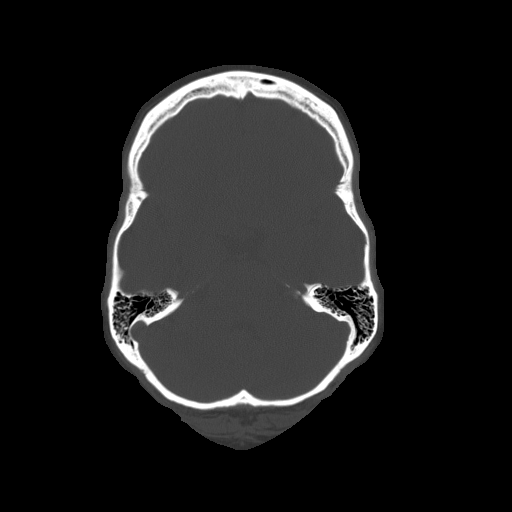
[im 18/56  bone]
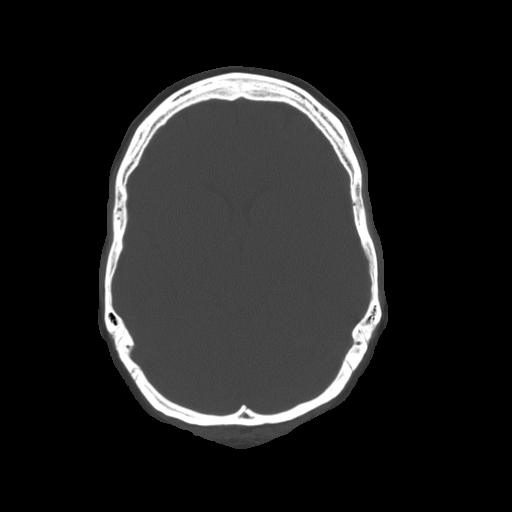
[im 24/56  bone]
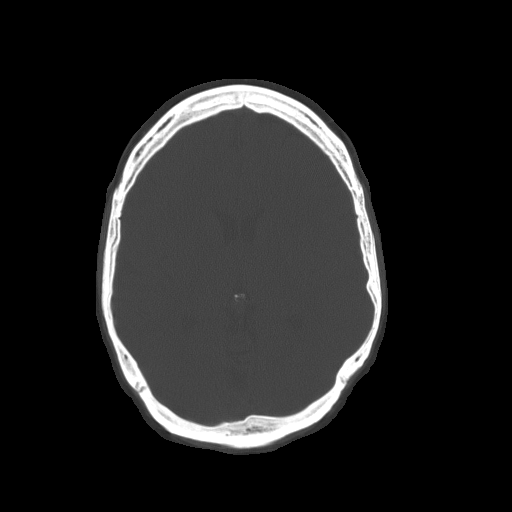
[im 32/56  bone]
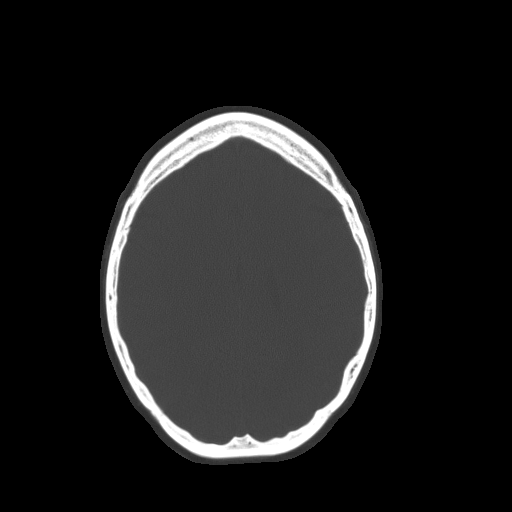
[im 38/56  bone]
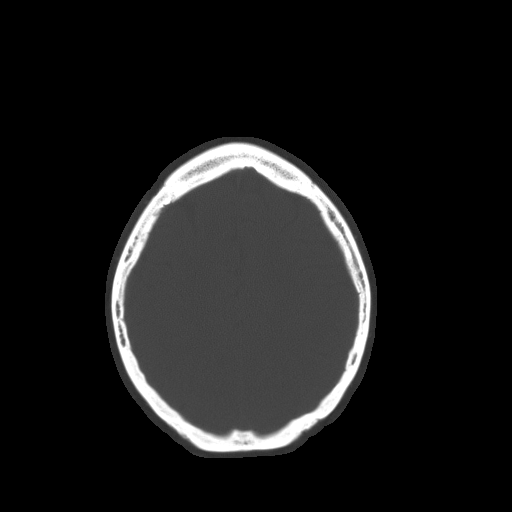
[im 44/56  bone]
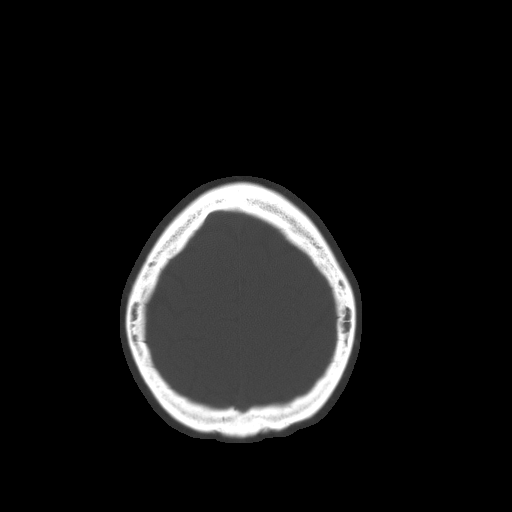
[im 50/56  bone]
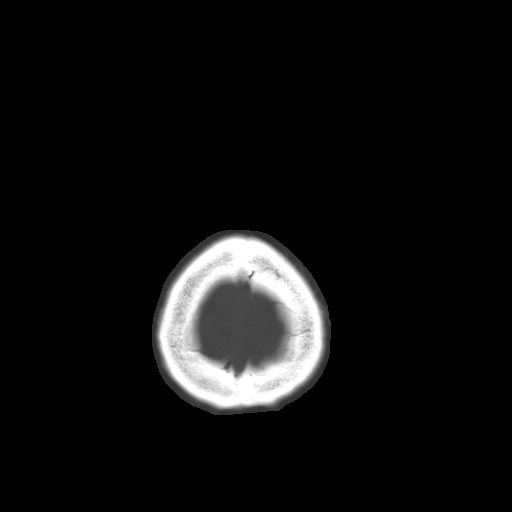

[16 of 30 positions shown; findings below may reference images not displayed]

FINDINGS: There is no evidence of acute intracranial hemorrhage,
mass lesion, brain edema or extra-axial fluid collection.  Low
density in the right occipital lobe on image 15 is unchanged,
probably a perivascular space or other incidental finding.  Pineal
calcifications are stable.  There is no hydrocephalus.

Mild mucosal thickening is present in the right division of the
sphenoid sinus.  The visualized paranasal sinuses are otherwise
clear.  The mastoids and middle ears are clear.  There are no acute
calvarial findings.
IMPRESSION: Stable examination.  No acute intracranial findings.

## 2013-10-16 ENCOUNTER — Other Ambulatory Visit: Payer: Self-pay

## 2014-03-02 ENCOUNTER — Ambulatory Visit: Payer: Self-pay | Admitting: Gynecology

## 2014-03-11 ENCOUNTER — Ambulatory Visit (INDEPENDENT_AMBULATORY_CARE_PROVIDER_SITE_OTHER): Payer: BC Managed Care – PPO | Admitting: Gynecology

## 2014-03-11 ENCOUNTER — Other Ambulatory Visit (HOSPITAL_COMMUNITY)
Admission: RE | Admit: 2014-03-11 | Discharge: 2014-03-11 | Disposition: A | Discharge status: Home / Self Care | Payer: BC Managed Care – PPO | Source: Ambulatory Visit | Attending: Gynecology | Admitting: Gynecology

## 2014-03-11 ENCOUNTER — Encounter: Payer: Self-pay | Admitting: Gynecology

## 2014-03-11 VITALS — BP 134/88

## 2014-03-11 DIAGNOSIS — Z113 Encounter for screening for infections with a predominantly sexual mode of transmission: Secondary | ICD-10-CM

## 2014-03-11 DIAGNOSIS — Z8742 Personal history of other diseases of the female genital tract: Secondary | ICD-10-CM

## 2014-03-11 DIAGNOSIS — N946 Dysmenorrhea, unspecified: Secondary | ICD-10-CM

## 2014-03-11 DIAGNOSIS — Z01419 Encounter for gynecological examination (general) (routine) without abnormal findings: Secondary | ICD-10-CM | POA: Insufficient documentation

## 2014-03-11 DIAGNOSIS — F3281 Premenstrual dysphoric disorder: Secondary | ICD-10-CM

## 2014-03-11 DIAGNOSIS — N943 Premenstrual tension syndrome: Secondary | ICD-10-CM

## 2014-03-11 DIAGNOSIS — N920 Excessive and frequent menstruation with regular cycle: Secondary | ICD-10-CM

## 2014-03-11 DIAGNOSIS — F17200 Nicotine dependence, unspecified, uncomplicated: Secondary | ICD-10-CM

## 2014-03-11 MED ORDER — TRANEXAMIC ACID 650 MG PO TABS: ORAL_TABLET | ORAL | Status: DC

## 2014-03-11 NOTE — Progress Notes (Signed)
[facility] Oct 01, 1966 [id number]   History:    48 y.o.  for annual gyn exam who stated that her last menstrual period lasted 11 days in the one prior to that was heavy lasting 7 days. No intermenstrual bleeding reported. Patient with long-standing history of PMDD, bipolar disorder and past history of alcoholism.   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 Psychiatrist manages severe bipolar disease medications and counseling. History of bleeding disorder with binging.Smoker 1-1/2 packs per day. History of breast cancer in mother and PGM, both after menopause. History of asthma. History of spontaneous galactorrhea with normal CT of brain 2012. Sonohysterogram July 2013 anteverted uterus with anterior fibroid 11 mm. Right ovary normal left ovary thin-walled echo-free 26 x 60 mm cyst, no defects seen.  Patient was complaining of some left breast tenderness but no palpable mass per se. Her last mammogram was over a year ago.    Past medical history,surgical history, family history and social history were all reviewed and documented in the EPIC chart.  Gynecologic History Patient's last menstrual period was 02/16/2014. Contraception: condoms Last Pap: 2013 . Results were: normal Last mammogram: 2010 . Results were: normal  Obstetric History OB History  Gravida Para Term Preterm AB SAB TAB Ectopic Multiple Living  1    1         # Outcome Date GA Lbr Len/2nd Weight Sex Delivery Anes PTL Lv  1 ABT                ROS: A ROS was performed and pertinent positives and negatives are included in the history.  GENERAL: No fevers or chills. HEENT: No change in vision, no earache, sore throat or sinus congestion. NECK: No pain or stiffness. CARDIOVASCULAR: No chest pain or pressure. No palpitations. PULMONARY: No shortness of breath, cough or wheeze. GASTROINTESTINAL: No abdominal pain, nausea, vomiting or diarrhea, melena or bright red blood per rectum.  GENITOURINARY: No urinary frequency, urgency, hesitancy or dysuria. MUSCULOSKELETAL: No joint or muscle pain, no back pain, no recent trauma. DERMATOLOGIC: No rash, no itching, no lesions. ENDOCRINE: No polyuria, polydipsia, no heat or cold intolerance. No recent change in weight. HEMATOLOGICAL: No anemia or easy bruising or bleeding. NEUROLOGIC: No headache, seizures, numbness, tingling or weakness. PSYCHIATRIC: No depression, no loss of interest in normal activity or change in sleep pattern.     Exam: chaperone present  BP 134/88  LMP 02/16/2014  There is no weight on file to calculate BMI.  General appearance : Well developed well nourished female. No acute distress HEENT: Neck supple, trachea midline, no carotid bruits, no thyroidmegaly Lungs: Clear to auscultation, no rhonchi or wheezes, or rib retractions  Heart: Regular rate and rhythm, no murmurs or gallops Breast:Examined in sitting and supine position were symmetrical in appearance, no palpable masses or tenderness,  no skin retraction, no nipple inversion, no nipple discharge, no skin discoloration, no axillary or supraclavicular lymphadenopathy Abdomen: no palpable masses or tenderness, no rebound or guarding Extremities: no edema or skin discoloration or tenderness  Pelvic:  Bartholin, Urethra, Skene Glands: Within normal limits             Vagina: No gross lesions or discharge  Cervix: No gross lesions or discharge  Uterus  anteverted , normal size, shape and consistency, non-tender and mobile  Adnexa  Without masses or tenderness  Anus and perineum  normal   Rectovaginal  normal sphincter tone without palpated masses or tenderness  Hemoccult not indicated     Assessment/Plan:  48 y.o. female for annual exam with history of menorrhagia and dysmenorrhea. Patient smokes one half pack cigarettes per day. Patient not a candidate for low-dose oral contraceptive pill because of risk of DVT and PE. She is going to be  prescribed Lysteda 650 mg 2 tablets 3 times a day for 5 days during her menses. Patient with past history of ovarian cyst returned back to the office next week for an ultrasound to better assess the right adnexa. We discussed progesterone only form of contraception will help with her cycles. I have given her literature information we will plan on placing a Nexplanon transdermal capsule the last 3 years. She will talk with her psychiatrist for adjustment of her medications that will help her with her mood swings. She was counseled once in the detrimental effects of smoking. We discussed importance of calcium and vitamin D for osteoporosis prevention. Patient was requesting STD screening stating that she had intercourse within individual whereby the condom broke she later found out that he was a drug user. A GC and Chlamydia culture was done today along with HIV, RPR, hepatitis B and CO be drawn today as well. She was reminded to schedule her mammogram which is overdue and we have discussed importance of monthly self breast examination.  Note: This dictation was prepared with  Dragon/digital dictation along withSmart phrase technology. Any transcriptional errors that result from this process are unintentional.   Terrance Mass MD, 1:51 PM 03/11/2014

## 2014-03-11 NOTE — Patient Instructions (Addendum)
Patient information: High cholesterol (The Basics)  What is cholesterol? - Cholesterol is a substance that is found in the blood. Everyone has some. It is needed for good health. The problem is, people sometimes have too much cholesterol. Compared with people with normal cholesterol, people with high cholesterol have a higher risk of heart attacks, strokes, and other health problems. The higher your cholesterol, the higher your risk of these problems. Cholesterol levels in your body are determined significantly by your diet. Cholesterol levels may also be related to heart disease. The following material helps to explain this relationship and discusses what you can do to help keep your heart healthy. Not all cholesterol is bad. Low-density lipoprotein (LDL) cholesterol is the "bad" cholesterol. It may cause fatty deposits to build up inside your arteries. High-density lipoprotein (HDL) cholesterol is "good." It helps to remove the "bad" LDL cholesterol from your blood. Cholesterol is a very important risk factor for heart disease. Other risk factors are high blood pressure, smoking, stress, heredity, and weight.  The heart muscle gets its supply of blood through the coronary arteries. If your LDL cholesterol is high and your HDL cholesterol is low, you are at risk for having fatty deposits build up in your coronary arteries. This leaves less room through which blood can flow. Without sufficient blood and oxygen, the heart muscle cannot function properly and you may feel chest pains (angina pectoris). When a coronary artery closes up entirely, a part of the heart muscle may die, causing a heart attack (myocardial infarction).  CHECKING CHOLESTEROL When your caregiver sends your blood to a lab to be analyzed for cholesterol, a complete lipid (fat) profile may be done. With this test, the total amount of cholesterol and levels of LDL and HDL are determined. Triglycerides  are a type of fat that circulates in the blood and can also be used to determine heart disease risk. Are there different types of cholesterol? - Yes, there are a few different types. If you get a cholesterol test, you may hear your doctor or nurse talk about: Total cholesterol  LDL cholesterol - Some people call this the "bad" cholesterol. That's because having high LDL levels raises your risk of heart attacks, strokes, and other health problems.  HDL cholesterol - Some people call this the "good" cholesterol. That's because having high HDL levels lowers your risk of heart attacks, strokes, and other health problems.  Non-HDL cholesterol - Non-HDL cholesterol is your total cholesterol minus your HDL cholesterol.  Triglycerides - Triglycerides are not cholesterol. They are a type of fat. But they often get measured when cholesterol is measured. (Having high triglycerides also seems to increase the risk of heart attacks and strokes.)   Keep in mind, though, that many people who cannot meet these goals still have a low risk of heart attacks and strokes. What should I do if my doctor tells me I have high cholesterol? - Ask your doctor what your overall risk of heart attacks and strokes is. High cholesterol, by itself, is not always a reason to worry. Having high cholesterol is just one of many things that can increase your risk of heart attacks and strokes. Other factors that increase your risk include:  Cigarette smoking  High blood pressure  Having a parent, sister, or brother who got heart disease at a young age (Young, in this case, means younger than 55 for men and younger than 65 for women.)  Being a man (Women are at risk, too, but men   have a higher risk.)  Older age  If you are at high risk of heart attacks and strokes, having high cholesterol is a problem. On the other hand, if you have are at low risk, having high cholesterol may not mean much. Should I take medicine to lower cholesterol? - Not  everyone who has high cholesterol needs medicines. Your doctor or nurse will decide if you need them based on your age, family history, and other health concerns.  You should probably take a cholesterol-lowering medicine called a statin if you: Already had a heart attack or stroke  Have known heart disease  Have diabetes  Have a condition called peripheral artery disease, which makes it painful to walk, and happens when the arteries in your legs get clogged with fatty deposits  Have an abdominal aortic aneurysm, which is a widening of the main artery in the belly  Most people with any of the conditions listed above should take a statin no matter what their cholesterol level is. If your doctor or nurse puts you on a statin, stay on it. The medicine may not make you feel any different. But it can help prevent heart attacks, strokes, and death.  Can I lower my cholesterol without medicines? - Yes, you can lower your cholesterol some by:  Avoiding red meat, butter, fried foods, cheese, and other foods that have a lot of saturated fat  Losing weight (if you are overweight)  Being more active Even if these steps do little to change your cholesterol, they can improve your health in many ways.                                                   Cholesterol Control Diet  CONTROLLING CHOLESTEROL WITH DIET Although exercise and lifestyle factors are important, your diet is key. That is because certain foods are known to raise cholesterol and others to lower it. The goal is to balance foods for their effect on cholesterol and more importantly, to replace saturated and trans fat with other types of fat, such as monounsaturated fat, polyunsaturated fat, and omega-3 fatty acids. On average, a person should consume no more than 15 to 17 g of saturated fat daily. Saturated and trans fats are considered "bad" fats, and they will raise LDL cholesterol. Saturated fats are primarily found in animal products such as  meats, butter, and cream. However, that does not mean you need to sacrifice all your favorite foods. Today, there are good tasting, low-fat, low-cholesterol substitutes for most of the things you like to eat. Choose low-fat or nonfat alternatives. Choose round or loin cuts of red meat, since these types of cuts are lowest in fat and cholesterol. Chicken (without the skin), fish, veal, and ground turkey breast are excellent choices. Eliminate fatty meats, such as hot dogs and salami. Even shellfish have little or no saturated fat. Have a 3 oz (85 g) portion when you eat lean meat, poultry, or fish. Trans fats are also called "partially hydrogenated oils." They are oils that have been scientifically manipulated so that they are solid at room temperature resulting in a longer shelf life and improved taste and texture of foods in which they are added. Trans fats are found in stick margarine, some tub margarines, cookies, crackers, and baked goods.  When baking and cooking, oils are an excellent substitute   for butter. The monounsaturated oils are especially beneficial since it is believed they lower LDL and raise HDL. The oils you should avoid entirely are saturated tropical oils, such as coconut and palm.  Remember to eat liberally from food groups that are naturally free of saturated and trans fat, including fish, fruit, vegetables, beans, grains (barley, rice, couscous, bulgur wheat), and pasta (without cream sauces).  IDENTIFYING FOODS THAT LOWER CHOLESTEROL  Soluble fiber may lower your cholesterol. This type of fiber is found in fruits such as apples, vegetables such as broccoli, potatoes, and carrots, legumes such as beans, peas, and lentils, and grains such as barley. Foods fortified with plant sterols (phytosterol) may also lower cholesterol. You should eat at least 2 g per day of these foods for a cholesterol lowering effect.  Read package labels to identify low-saturated fats, trans fats free, and  low-fat foods at the supermarket. Select cheeses that have only 2 to 3 g saturated fat per ounce. Use a heart-healthy tub margarine that is free of trans fats or partially hydrogenated oil. When buying baked goods (cookies, crackers), avoid partially hydrogenated oils. Breads and muffins should be made from whole grains (whole-wheat or whole oat flour, instead of "flour" or "enriched flour"). Buy non-creamy canned soups with reduced salt and no added fats.  FOOD PREPARATION TECHNIQUES  Never deep-fry. If you must fry, either stir-fry, which uses very little fat, or use non-stick cooking sprays. When possible, broil, bake, or roast meats, and steam vegetables. Instead of dressing vegetables with butter or margarine, use lemon and herbs, applesauce and cinnamon (for squash and sweet potatoes), nonfat yogurt, salsa, and low-fat dressings for salads.  LOW-SATURATED FAT / LOW-FAT FOOD SUBSTITUTES Meats / Saturated Fat (g)  Avoid: Steak, marbled (3 oz/85 g) / 11 g   Choose: Steak, lean (3 oz/85 g) / 4 g   Avoid: Hamburger (3 oz/85 g) / 7 g   Choose: Hamburger, lean (3 oz/85 g) / 5 g   Avoid: Ham (3 oz/85 g) / 6 g   Choose: Ham, lean cut (3 oz/85 g) / 2.4 g   Avoid: Chicken, with skin, dark meat (3 oz/85 g) / 4 g   Choose: Chicken, skin removed, dark meat (3 oz/85 g) / 2 g   Avoid: Chicken, with skin, light meat (3 oz/85 g) / 2.5 g   Choose: Chicken, skin removed, light meat (3 oz/85 g) / 1 g  Dairy / Saturated Fat (g)  Avoid: Whole milk (1 cup) / 5 g   Choose: Low-fat milk, 2% (1 cup) / 3 g   Choose: Low-fat milk, 1% (1 cup) / 1.5 g   Choose: Skim milk (1 cup) / 0.3 g   Avoid: Hard cheese (1 oz/28 g) / 6 g   Choose: Skim milk cheese (1 oz/28 g) / 2 to 3 g   Avoid: Cottage cheese, 4% fat (1 cup) / 6.5 g   Choose: Low-fat cottage cheese, 1% fat (1 cup) / 1.5 g   Avoid: Ice cream (1 cup) / 9 g   Choose: Sherbet (1 cup) / 2.5 g   Choose: Nonfat frozen yogurt (1 cup) / 0.3 g    Choose: Frozen fruit bar / trace   Avoid: Whipped cream (1 tbs) / 3.5 g   Choose: Nondairy whipped topping (1 tbs) / 1 g  Condiments / Saturated Fat (g)  Avoid: Mayonnaise (1 tbs) / 2 g   Choose: Low-fat mayonnaise (1 tbs) / 1 g   Avoid:   Butter (1 tbs) / 7 g   Choose: Extra light margarine (1 tbs) / 1 g   Avoid: Coconut oil (1 tbs) / 11.8 g   Choose: Olive oil (1 tbs) / 1.8 g   Choose: Corn oil (1 tbs) / 1.7 g   Choose: Safflower oil (1 tbs) / 1.2 g   Choose: Sunflower oil (1 tbs) / 1.4 g   Choose: Soybean oil (1 tbs) / 2.4 g   Choose: Canola oil (1 tbs) / 1 g  Exercise to Lose Weight Exercise and a healthy diet may help you lose weight. Your doctor may suggest specific exercises. EXERCISE IDEAS AND TIPS Choose low-cost things you enjoy doing, such as walking, bicycling, or exercising to workout videos.  Take stairs instead of the elevator.  Walk during your lunch break.  Park your car further away from work or school.  Go to a gym or an exercise class.  Start with 5 to 10 minutes of exercise each day. Build up to 30 minutes of exercise 4 to 6 days a week.  Wear shoes with good support and comfortable clothes.  Stretch before and after working out.  Work out until you breathe harder and your heart beats faster.  Drink extra water when you exercise.  Do not do so much that you hurt yourself, feel dizzy, or get very short of breath.  Exercises that burn about 150 calories: Running 1  miles in 15 minutes.  Playing volleyball for 45 to 60 minutes.  Washing and waxing a car for 45 to 60 minutes.  Playing touch football for 45 minutes.  Walking 1  miles in 35 minutes.  Pushing a stroller 1  miles in 30 minutes.  Playing basketball for 30 minutes.  Raking leaves for 30 minutes.  Bicycling 5 miles in 30 minutes.  Walking 2 miles in 30 minutes.  Dancing for 30 minutes.  Shoveling snow for 15 minutes.  Swimming laps for 20 minutes.  Walking up stairs for 15  minutes.  Bicycling 4 miles in 15 minutes.  Gardening for 30 to 45 minutes.  Jumping rope for 15 minutes.  Washing windows or floors for 45 to 60 minutes.  Document Released: 12/30/2010 Document Revised: 08/09/2011 Document Reviewed: 12/30/2010 [facility] Patient Information 2012 [place].    Etonogestrel implant What is this medicine? ETONOGESTREL (et oh noe JES trel) is a contraceptive (birth control) device. It is used to prevent pregnancy. It can be used for up to 3 years. This medicine may be used for other purposes; ask your health care provider or pharmacist if you have questions. COMMON BRAND NAME(S): Implanon, Nexplanon  What should I tell my health care provider before I take this medicine? They need to know if you have any of these conditions: -abnormal vaginal bleeding -blood vessel disease or blood clots -cancer of the breast, cervix, or liver -depression -diabetes -gallbladder disease -headaches -heart disease or recent heart attack -high blood pressure -high cholesterol -kidney disease -liver disease -renal disease -seizures -tobacco smoker -an unusual or allergic reaction to etonogestrel, other hormones, anesthetics or antiseptics, medicines, foods, dyes, or preservatives -pregnant or trying to get pregnant -breast-feeding How should I use this medicine? This device is inserted just under the skin on the inner side of your upper arm by a health care professional. Talk to your pediatrician regarding the use of this medicine in children. Special care may be needed. Overdosage: If you think you've taken too much of this medicine contact a poison control  center or emergency room at once. Overdosage: If you think you have taken too much of this medicine contact a poison control center or emergency room at once. NOTE: This medicine is only for you. Do not share this medicine with others. What if I miss a dose? This does not apply. What may interact with this  medicine? Do not take this medicine with any of the following medications: -amprenavir -bosentan -fosamprenavir This medicine may also interact with the following medications: -barbiturate medicines for inducing sleep or treating seizures -certain medicines for fungal infections like ketoconazole and itraconazole -griseofulvin -medicines to treat seizures like carbamazepine, felbamate, oxcarbazepine, phenytoin, topiramate -modafinil -phenylbutazone -rifampin -some medicines to treat HIV infection like atazanavir, indinavir, lopinavir, nelfinavir, tipranavir, ritonavir -St. John's wort This list may not describe all possible interactions. Give your health care provider a list of all the medicines, herbs, non-prescription drugs, or dietary supplements you use. Also tell them if you smoke, drink alcohol, or use illegal drugs. Some items may interact with your medicine. What should I watch for while using this medicine? This product does not protect you against HIV infection (AIDS) or other sexually transmitted diseases. You should be able to feel the implant by pressing your fingertips over the skin where it was inserted. Tell your doctor if you cannot feel the implant. What side effects may I notice from receiving this medicine? Side effects that you should report to your doctor or health care professional as soon as possible: -allergic reactions like skin rash, itching or hives, swelling of the face, lips, or tongue -breast lumps -changes in vision -confusion, trouble speaking or understanding -dark urine -depressed mood -general ill feeling or flu-like symptoms -light-colored stools -loss of appetite, nausea -right upper belly pain -severe headaches -severe pain, swelling, or tenderness in the abdomen -shortness of breath, chest pain, swelling in a leg -signs of pregnancy -sudden numbness or weakness of the face, arm or leg -trouble walking, dizziness, loss of balance or  coordination -unusual vaginal bleeding, discharge -unusually weak or tired -yellowing of the eyes or skin Side effects that usually do not require medical attention (Report these to your doctor or health care professional if they continue or are bothersome.): -acne -breast pain -changes in weight -cough -fever or chills -headache -irregular menstrual bleeding -itching, burning, and vaginal discharge -pain or difficulty passing urine -sore throat This list may not describe all possible side effects. Call your doctor for medical advice about side effects. You may report side effects to FDA at 1-800-FDA-1088. Where should I keep my medicine? This drug is given in a hospital or clinic and will not be stored at home. NOTE: This sheet is a summary. It may not cover all possible information. If you have questions about this medicine, talk to your doctor, pharmacist, or health care provider.  2014, [employer]. (2012-06-03 15:37:45)

## 2014-03-12 LAB — RPR

## 2014-03-12 LAB — GC/CHLAMYDIA PROBE AMP
CT PROBE, AMP APTIMA: NEGATIVE
GC PROBE AMP APTIMA: NEGATIVE

## 2014-03-12 LAB — HIV ANTIBODY (ROUTINE TESTING W REFLEX): HIV: NONREACTIVE

## 2014-03-12 LAB — HEPATITIS C ANTIBODY: HCV AB: NEGATIVE

## 2014-03-12 LAB — HEPATITIS B SURFACE ANTIGEN: HEP B S AG: NEGATIVE

## 2014-03-16 ENCOUNTER — Telehealth: Payer: Self-pay | Admitting: *Deleted

## 2014-03-16 NOTE — Telephone Encounter (Signed)
Pt informed with lab results on 03/11/14

## 2014-03-18 ENCOUNTER — Ambulatory Visit: Payer: BC Managed Care – PPO | Admitting: Gynecology

## 2014-03-18 ENCOUNTER — Other Ambulatory Visit: Payer: Self-pay | Admitting: Gynecology

## 2014-03-18 ENCOUNTER — Other Ambulatory Visit: Payer: BC Managed Care – PPO

## 2014-03-18 DIAGNOSIS — Z3049 Encounter for surveillance of other contraceptives: Secondary | ICD-10-CM

## 2014-03-26 ENCOUNTER — Ambulatory Visit: Payer: BC Managed Care – PPO | Admitting: Gynecology

## 2014-03-27 ENCOUNTER — Telehealth: Payer: Self-pay | Admitting: Gynecology

## 2014-03-27 NOTE — Telephone Encounter (Signed)
03/27/14-PT INS COVERS THE NEXPLANON AND INSERTION AT 100%/WL

## 2014-04-01 ENCOUNTER — Ambulatory Visit (INDEPENDENT_AMBULATORY_CARE_PROVIDER_SITE_OTHER): Payer: BC Managed Care – PPO | Admitting: Gynecology

## 2014-04-01 ENCOUNTER — Other Ambulatory Visit: Payer: Self-pay | Admitting: Gynecology

## 2014-04-01 ENCOUNTER — Ambulatory Visit (INDEPENDENT_AMBULATORY_CARE_PROVIDER_SITE_OTHER): Payer: BC Managed Care – PPO

## 2014-04-01 ENCOUNTER — Encounter: Payer: Self-pay | Admitting: Gynecology

## 2014-04-01 VITALS — BP 136/90

## 2014-04-01 DIAGNOSIS — D251 Intramural leiomyoma of uterus: Secondary | ICD-10-CM

## 2014-04-01 DIAGNOSIS — N946 Dysmenorrhea, unspecified: Secondary | ICD-10-CM

## 2014-04-01 DIAGNOSIS — N83209 Unspecified ovarian cyst, unspecified side: Secondary | ICD-10-CM

## 2014-04-01 DIAGNOSIS — N920 Excessive and frequent menstruation with regular cycle: Secondary | ICD-10-CM

## 2014-04-01 DIAGNOSIS — R102 Pelvic and perineal pain unspecified side: Secondary | ICD-10-CM

## 2014-04-01 DIAGNOSIS — D219 Benign neoplasm of connective and other soft tissue, unspecified: Secondary | ICD-10-CM

## 2014-04-01 DIAGNOSIS — D259 Leiomyoma of uterus, unspecified: Secondary | ICD-10-CM

## 2014-04-01 DIAGNOSIS — Z8742 Personal history of other diseases of the female genital tract: Secondary | ICD-10-CM

## 2014-04-01 DIAGNOSIS — N949 Unspecified condition associated with female genital organs and menstrual cycle: Secondary | ICD-10-CM

## 2014-04-01 DIAGNOSIS — N83202 Unspecified ovarian cyst, left side: Secondary | ICD-10-CM

## 2014-04-01 NOTE — Patient Instructions (Signed)
CA-125 Tumor Marker CA 125 is a tumor marker that is used to help monitor the course of ovarian or endometrial cancer. PREPARATION FOR TEST No preparation is necessary. NORMAL FINDINGS Adults: 0-35 units/mL (0-35 kilounits)/L Ranges for normal findings may vary among different laboratories and hospitals. You should always check with your doctor after having lab work or other tests done to discuss the meaning of your test results and whether your values are considered within normal limits. MEANING OF TEST  Your caregiver will go over the test results with you and discuss the importance and meaning of your results, as well as treatment options and the need for additional tests if necessary. OBTAINING THE TEST RESULTS It is your responsibility to obtain your test results. Ask the lab or department performing the test when and how you will get your results. Document Released: 12/19/2004 Document Revised: 02/19/2012 Document Reviewed: 11/04/2008 ExitCare Patient Information 2014 [employer]. Ovarian Cyst An ovarian cyst is a fluid-filled sac that forms on an ovary. The ovaries are small organs that produce eggs in women. Various types of cysts can form on the ovaries. Most are not cancerous. Many do not cause problems, and they often go away on their own. Some may cause symptoms and require treatment. Common types of ovarian cysts include:  Functional cysts These cysts may occur every month during the menstrual cycle. This is normal. The cysts usually go away with the next menstrual cycle if the woman does not get pregnant. Usually, there are no symptoms with a functional cyst.  Endometrioma cysts These cysts form from the tissue that lines the uterus. They are also called "chocolate cysts" because they become filled with blood that turns brown. This type of cyst can cause pain in the lower abdomen during intercourse and with your menstrual period.  Cystadenoma cysts This type develops from the  cells on the outside of the ovary. These cysts can get very big and cause lower abdomen pain and pain with intercourse. This type of cyst can twist on itself, cut off its blood supply, and cause severe pain. It can also easily rupture and cause a lot of pain.  Dermoid cysts This type of cyst is sometimes found in both ovaries. These cysts may contain different kinds of body tissue, such as skin, teeth, hair, or cartilage. They usually do not cause symptoms unless they get very big.  Theca lutein cysts These cysts occur when too much of a certain hormone (human chorionic gonadotropin) is produced and overstimulates the ovaries to produce an egg. This is most common after procedures used to assist with the conception of a baby (in vitro fertilization). CAUSES   Fertility drugs can cause a condition in which multiple large cysts are formed on the ovaries. This is called ovarian hyperstimulation syndrome.  A condition called polycystic ovary syndrome can cause hormonal imbalances that can lead to nonfunctional ovarian cysts. SIGNS AND SYMPTOMS  Many ovarian cysts do not cause symptoms. If symptoms are present, they may include:  Pelvic pain or pressure.  Pain in the lower abdomen.  Pain during sexual intercourse.  Increasing girth (swelling) of the abdomen.  Abnormal menstrual periods.  Increasing pain with menstrual periods.  Stopping having menstrual periods without being pregnant. DIAGNOSIS  These cysts are commonly found during a routine or annual pelvic exam. Tests may be ordered to find out more about the cyst. These tests may include:  Ultrasound.  X-ray of the pelvis.  CT scan.  MRI.  Blood tests.   TREATMENT  Many ovarian cysts go away on their own without treatment. Your health care provider may want to check your cyst regularly for 2 3 months to see if it changes. For women in menopause, it is particularly important to monitor a cyst closely because of the higher rate of  ovarian cancer in menopausal women. When treatment is needed, it may include any of the following:  A procedure to drain the cyst (aspiration). This may be done using a long needle and ultrasound. It can also be done through a laparoscopic procedure. This involves using a thin, lighted tube with a tiny camera on the end (laparoscope) inserted through a small incision.  Surgery to remove the whole cyst. This may be done using laparoscopic surgery or an open surgery involving a larger incision in the lower abdomen.  Hormone treatment or birth control pills. These methods are sometimes used to help dissolve a cyst. HOME CARE INSTRUCTIONS   Only take over-the-counter or prescription medicines as directed by your health care provider.  Follow up with your health care provider as directed.  Get regular pelvic exams and Pap tests. SEEK MEDICAL CARE IF:   Your periods are late, irregular, or painful, or they stop.  Your pelvic pain or abdominal pain does not go away.  Your abdomen becomes larger or swollen.  You have pressure on your bladder or trouble emptying your bladder completely.  You have pain during sexual intercourse.  You have feelings of fullness, pressure, or discomfort in your stomach.  You lose weight for no apparent reason.  You feel generally ill.  You become constipated.  You lose your appetite.  You develop acne.  You have an increase in body and facial hair.  You are gaining weight, without changing your exercise and eating habits.  You think you are pregnant. SEEK IMMEDIATE MEDICAL CARE IF:   You have increasing abdominal pain.  You feel sick to your stomach (nauseous), and you throw up (vomit).  You develop a fever that comes on suddenly.  You have abdominal pain during a bowel movement.  Your menstrual periods become heavier than usual. Document Released: 11/27/2005 Document Revised: 09/17/2013 Document Reviewed: 08/04/2013 ExitCare Patient  Information 2014 [employer].  

## 2014-04-01 NOTE — Progress Notes (Signed)
   Patient was seen in the office on April 1 for her annual gynecological examination and due the fact that she is overweight and had a past history of a left thin-walled ovarian cyst measuring 26 x 16  mm echo-free was instructed to return to the office today for followup ultrasound.Patient with long-standing history of PMDD, bipolar disorder and past history of alcoholism.   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 Psychiatrist manages severe bipolar disease medications and counseling. History of eating  disorder with binging.Smoker 1-1/2 packs per day. History of breast cancer in mother and PGM, both after menopause. History of asthma. History of spontaneous galactorrhea with normal CT of brain 2012. Sonohysterogram July 2013 anteverted uterus with anterior fibroid 11 mm. Right ovary normal left ovary thin-walled echo-free 26 x 60 mm cyst, no defects seen.  Due to patient's smoking history it was advised for cycle control as well as for contraception for her to have a Nexplanon for which she was scheduled with her next menstrual cycle. Her recent STD screen and Pap smear were normal.  Ultrasound today: Uterus measures 7.5 x 5.0 x 3.4 cm with endometrial stripe of 9.1 mm. Patient with a right subserous fibroid 14 x 14 mm. Right ovary normal left ovary thin-walled cyst measuring 20 x 23 x 16 mm internal low-level echoes avascular negative fluid in the cul-de-sac. Unchanged from 2 years ago.  Small nonsuspicious ovarian cyst unchanged for past 2 years. Patient doing well otherwise we will check a CA 125 tumor marker today. Its limitations were discussed with the patient. Patient will return with her next cycle for her Nexplanon.

## 2014-04-02 LAB — CA 125: CA 125: 8.3 U/mL (ref 0.0–30.2)

## 2014-05-14 ENCOUNTER — Encounter: Payer: Self-pay | Admitting: Gynecology

## 2014-05-14 ENCOUNTER — Ambulatory Visit (INDEPENDENT_AMBULATORY_CARE_PROVIDER_SITE_OTHER): Payer: BC Managed Care – PPO | Admitting: Gynecology

## 2014-05-14 VITALS — BP 122/70

## 2014-05-14 DIAGNOSIS — Z975 Presence of (intrauterine) contraceptive device: Secondary | ICD-10-CM | POA: Insufficient documentation

## 2014-05-14 DIAGNOSIS — Z30017 Encounter for initial prescription of implantable subdermal contraceptive: Secondary | ICD-10-CM

## 2014-05-14 NOTE — Progress Notes (Signed)
   Patient presented to the office today to have a Nexplanon placed for contraception and  cycle control. Patient is a smoker. Patient seen at time of annual exam and was provided with literature information on this form of contraception. The risks benefits and pros and cons were discussed. Patient currently menstruating today.                                              Nexplanon Procedure Note (insertion)     The patient was laying on her back with her nondominant arm (right) flexed at the elbow and externally rotated. The insertion site was identified as the underside of the nondominant upper arm approximately 8 cm from the medial epicondyle of the humerus. 2 marks were made with a sterile marker: The first marked the spot where the Nexplanon  implant was to be inserted, and a second, marked a spot a few centimeters proximal to the first marke to guide the direction of the insertion. The area was cleansed with Betadine solution. The area was anesthetized with 1% lidocaine  (1 cc)  at the area the injection site and underneath the skin along the planned insertion tunnel. The preloaded disposable Nexplanon was removed from its sterile casing.  The applicator was held above the needle at the textured surface area. The transparent protector was removed. With a freehand, the skin was stretched around the insertion site with a thumb and index finger. The skin was then punctured with the tip of the needle angled at 30. The Nexplanon applicator was lowered to a horizontal position. While lifting the skin with the tip of the needle the needle was then slid to its full length. The applicator was kept in sitting position with a needle inserted to its full length. The purple slider was unlocked by pushing it slightly downward. The slider was fully moved back until it stopped. This allowed the implant to be in the final subdermal position and the needle was locked inside the body of the applicator. The applicator  was then removed. A Steri-Strip was made over the incision and a Kerlix wrap was placed which patient is to remove tomorrow. No complications patient tolerated procedure well and was released home with instructions.   Starlyn Skeans [id number] ZCHY@  Note: This dictation was prepared with  Dragon/digital dictation along withSmart phrase technology. Any transcriptional errors that result from this process are unintentional.

## 2014-05-14 NOTE — Patient Instructions (Signed)
Etonogestrel implant What is this medicine? ETONOGESTREL (et oh noe JES trel) is a contraceptive (birth control) device. It is used to prevent pregnancy. It can be used for up to 3 years. This medicine may be used for other purposes; ask your health care provider or pharmacist if you have questions. COMMON BRAND NAME(S): Implanon, Nexplanon  What should I tell my health care provider before I take this medicine? They need to know if you have any of these conditions: -abnormal vaginal bleeding -blood vessel disease or blood clots -cancer of the breast, cervix, or liver -depression -diabetes -gallbladder disease -headaches -heart disease or recent heart attack -high blood pressure -high cholesterol -kidney disease -liver disease -renal disease -seizures -tobacco smoker -an unusual or allergic reaction to etonogestrel, other hormones, anesthetics or antiseptics, medicines, foods, dyes, or preservatives -pregnant or trying to get pregnant -breast-feeding How should I use this medicine? This device is inserted just under the skin on the inner side of your upper arm by a health care professional. Talk to your pediatrician regarding the use of this medicine in children. Special care may be needed. Overdosage: If you think you've taken too much of this medicine contact a poison control center or emergency room at once. Overdosage: If you think you have taken too much of this medicine contact a poison control center or emergency room at once. NOTE: This medicine is only for you. Do not share this medicine with others. What if I miss a dose? This does not apply. What may interact with this medicine? Do not take this medicine with any of the following medications: -amprenavir -bosentan -fosamprenavir This medicine may also interact with the following medications: -barbiturate medicines for inducing sleep or treating seizures -certain medicines for fungal infections like ketoconazole and  itraconazole -griseofulvin -medicines to treat seizures like carbamazepine, felbamate, oxcarbazepine, phenytoin, topiramate -modafinil -phenylbutazone -rifampin -some medicines to treat HIV infection like atazanavir, indinavir, lopinavir, nelfinavir, tipranavir, ritonavir -St. John's wort This list may not describe all possible interactions. Give your health care provider a list of all the medicines, herbs, non-prescription drugs, or dietary supplements you use. Also tell them if you smoke, drink alcohol, or use illegal drugs. Some items may interact with your medicine. What should I watch for while using this medicine? This product does not protect you against HIV infection (AIDS) or other sexually transmitted diseases. You should be able to feel the implant by pressing your fingertips over the skin where it was inserted. Tell your doctor if you cannot feel the implant. What side effects may I notice from receiving this medicine? Side effects that you should report to your doctor or health care professional as soon as possible: -allergic reactions like skin rash, itching or hives, swelling of the face, lips, or tongue -breast lumps -changes in vision -confusion, trouble speaking or understanding -dark urine -depressed mood -general ill feeling or flu-like symptoms -light-colored stools -loss of appetite, nausea -right upper belly pain -severe headaches -severe pain, swelling, or tenderness in the abdomen -shortness of breath, chest pain, swelling in a leg -signs of pregnancy -sudden numbness or weakness of the face, arm or leg -trouble walking, dizziness, loss of balance or coordination -unusual vaginal bleeding, discharge -unusually weak or tired -yellowing of the eyes or skin Side effects that usually do not require medical attention (Report these to your doctor or health care professional if they continue or are bothersome.): -acne -breast pain -changes in  weight -cough -fever or chills -headache -irregular menstrual bleeding -itching, burning,   and vaginal discharge -pain or difficulty passing urine -sore throat This list may not describe all possible side effects. Call your doctor for medical advice about side effects. You may report side effects to FDA at 1-800-FDA-1088. Where should I keep my medicine? This drug is given in a hospital or clinic and will not be stored at home. NOTE: This sheet is a summary. It may not cover all possible information. If you have questions about this medicine, talk to your doctor, pharmacist, or health care provider.  2014, Elsevier/Gold Standard. (2012-06-03 15:37:45)  

## 2014-05-18 ENCOUNTER — Encounter: Payer: Self-pay | Admitting: Gynecology

## 2014-06-03 ENCOUNTER — Telehealth: Payer: Self-pay | Admitting: *Deleted

## 2014-06-03 NOTE — Telephone Encounter (Signed)
Pt had Nexplanon placed on 05/14/14 c/o bleeding for about 1 week now, had cycle on placement day of nexplanon. Pt said she is changing pads about every 4 hours, pt bipolar takes medication daily, but seems like mood swings have been worse since placement. Pt wondering if this could be side effect from nexplanon?

## 2014-06-03 NOTE — Telephone Encounter (Signed)
Progesterone effect likely. Call in Estreace 1 mg to take one PO q daily for 30 days only to help regulate her cycle.

## 2014-06-04 NOTE — Telephone Encounter (Signed)
Pt informed, she has stopped bleeding will not need the Rx.

## 2014-06-09 ENCOUNTER — Ambulatory Visit
Admission: RE | Admit: 2014-06-09 | Discharge: 2014-06-09 | Disposition: A | Discharge status: Home / Self Care | Payer: BC Managed Care – PPO | Source: Ambulatory Visit | Attending: Family Medicine | Admitting: Family Medicine

## 2014-06-09 ENCOUNTER — Other Ambulatory Visit: Payer: Self-pay | Admitting: Family Medicine

## 2014-06-09 DIAGNOSIS — R059 Cough, unspecified: Secondary | ICD-10-CM

## 2014-06-09 DIAGNOSIS — R05 Cough: Secondary | ICD-10-CM

## 2014-06-24 ENCOUNTER — Telehealth: Payer: Self-pay | Admitting: *Deleted

## 2014-06-24 NOTE — Telephone Encounter (Signed)
Pt has Nexplanon placed on 05/14/14 is bipolar disorder pt bipolar takes medication daily, but seems like mood swings have been worse since placement. Pt said she has been feeling more depressed and just "feeling out of wack". Asked if this could be possible from nexplanon? Please advise

## 2014-06-24 NOTE — Telephone Encounter (Signed)
Emotional lability and nervousness  And depression have been reported as possible side effects which may or may not be temporary

## 2014-06-24 NOTE — Telephone Encounter (Signed)
Pt informed with the below note. 

## 2014-07-08 ENCOUNTER — Ambulatory Visit: Payer: BC Managed Care – PPO | Admitting: Gynecology

## 2014-07-14 ENCOUNTER — Encounter: Payer: Self-pay | Admitting: Gynecology

## 2014-07-14 ENCOUNTER — Ambulatory Visit (INDEPENDENT_AMBULATORY_CARE_PROVIDER_SITE_OTHER): Payer: BC Managed Care – PPO | Admitting: Gynecology

## 2014-07-14 VITALS — BP 132/84

## 2014-07-14 DIAGNOSIS — Z975 Presence of (intrauterine) contraceptive device: Secondary | ICD-10-CM

## 2014-07-14 DIAGNOSIS — Z3046 Encounter for surveillance of implantable subdermal contraceptive: Secondary | ICD-10-CM

## 2014-07-14 DIAGNOSIS — Z309 Encounter for contraceptive management, unspecified: Secondary | ICD-10-CM

## 2014-07-14 DIAGNOSIS — R635 Abnormal weight gain: Secondary | ICD-10-CM

## 2014-07-14 DIAGNOSIS — N921 Excessive and frequent menstruation with irregular cycle: Secondary | ICD-10-CM

## 2014-07-14 NOTE — Patient Instructions (Signed)
Intrauterine Device Information An intrauterine device (IUD) is inserted into your uterus to prevent pregnancy. There are two types of IUDs available:   Copper IUD--This type of IUD is wrapped in copper wire and is placed inside the uterus. Copper makes the uterus and fallopian tubes produce a fluid that kills sperm. The copper IUD can stay in place for 10 years.  Hormone IUD--This type of IUD contains the hormone progestin (synthetic progesterone). The hormone thickens the cervical mucus and prevents sperm from entering the uterus. It also thins the uterine lining to prevent implantation of a fertilized egg. The hormone can weaken or kill the sperm that get into the uterus. One type of hormone IUD can stay in place for 5 years, and another type can stay in place for 3 years. Your health care provider will make sure you are a good candidate for a contraceptive IUD. Discuss with your health care provider the possible side effects.  ADVANTAGES OF AN INTRAUTERINE DEVICE  IUDs are highly effective, reversible, long acting, and low maintenance.   There are no estrogen-related side effects.   An IUD can be used when breastfeeding.   IUDs are not associated with weight gain.   The copper IUD works immediately after insertion.   The hormone IUD works right away if inserted within 7 days of your period starting. You will need to use a backup method of birth control for 7 days if the hormone IUD is inserted at any other time in your cycle.  The copper IUD does not interfere with your female hormones.   The hormone IUD can make heavy menstrual periods lighter and decrease cramping.   The hormone IUD can be used for 3 or 5 years.   The copper IUD can be used for 10 years. DISADVANTAGES OF AN INTRAUTERINE DEVICE  The hormone IUD can be associated with irregular bleeding patterns.   The copper IUD can make your menstrual flow heavier and more painful.   You may experience cramping and  vaginal bleeding after insertion.  Document Released: 10/31/2004 Document Revised: 07/30/2013 Document Reviewed: 05/18/2013 ExitCare Patient Information 2015 [employer]. This information is not intended to replace advice given to you by your health care provider. Make sure you discuss any questions you have with your health care provider.  

## 2014-07-14 NOTE — Progress Notes (Signed)
    Patient presented to the office today to have her Nexplanon removed which was placed in June of 2015. Patient complains of breast tenderness bloating and continued irregular bleeding and wanted to have it removed. The patient suffers from bipolar disorder and is a chronic smoker. She is in the process of going to the [facility] for 1 week treatment for smoking cessation. Her psychiatrist is working with her on her bipolar disorder see medication list. The patient also has history of PCO S. and her PCP has her on Glucophage for insulin resistance. Patient has not taken her medication the past few days and wanted to have her hemoglobin A1c check today as well. She has complained also of weight gain.                                                             Nexplanon procedure note (removal)  The patient presented to the office today requesting for removal of her Nexplanon that was placed in the year 2015 on her right arm.   On examination the nexplanon implant was palpated and the distal end  (end  closest to the elbow) was marked. The area was sterilized with Betadine solution. 1% lidocaine was used for local anesthesia and approximately 1 cc  was injected into the site that was marked where  the incision was to be made. The local anesthetic was injected under the implant in an effort to keep it  close to the skin surface. Slight pressure pushing downward was made at the proximal end  of the implant in an effort to stabilize it. A bulge appeared indicating the distal end of the implant. Starting at the distal tip of the implant, a small longitudinal incision of 2 mm was made towards the elbow. By gently pushing the implant toward the incision the tip became visible. Grasping the implant with a curved forcep facilitated in gently removing the implant. Full confirmation of the entire implant which is 4 cm long was inspected and was intact and was shown to the patient and discarded. After removing the  implant, the incision was closed with 2 interrupted sutures of 3-0 Vicryl and applying an adhesive Kerlix bandage. Patient will be instructed to remove the pressure bandage in 24 hours and the Steri-Strips in 3-5 days. We'll remove her suture when she returns one week later when we placed the ParaGard T380A IUD for which the literature information was provided today as well. She will stop by the lab the we will check her hemoglobin A1c and TSH today.  Note: This dictation was prepared with  Dragon/digital dictation along withSmart phrase technology. Any transcriptional errors that result from this process are unintentional.

## 2014-07-15 ENCOUNTER — Other Ambulatory Visit: Payer: Self-pay | Admitting: Gynecology

## 2014-07-15 DIAGNOSIS — R7989 Other specified abnormal findings of blood chemistry: Secondary | ICD-10-CM

## 2014-07-15 DIAGNOSIS — R7309 Other abnormal glucose: Secondary | ICD-10-CM

## 2014-07-15 LAB — TSH: TSH: 0.262 u[IU]/mL — AB (ref 0.350–4.500)

## 2014-07-15 LAB — HEMOGLOBIN A1C
HEMOGLOBIN A1C: 5.8 % — AB (ref <5.7)
Mean Plasma Glucose: 120 mg/dL — ABNORMAL HIGH (ref <117)

## 2014-07-16 ENCOUNTER — Telehealth: Payer: Self-pay | Admitting: Gynecology

## 2014-07-16 ENCOUNTER — Other Ambulatory Visit: Payer: Self-pay | Admitting: Gynecology

## 2014-07-16 DIAGNOSIS — Z3049 Encounter for surveillance of other contraceptives: Secondary | ICD-10-CM

## 2014-07-16 NOTE — Telephone Encounter (Signed)
07/16/14-LM VM for pt that her BC ins covers the Paraguard IUD and insertion at 100%, no copay.Ref#[id number]

## 2014-07-20 ENCOUNTER — Ambulatory Visit: Payer: BC Managed Care – PPO | Admitting: Gynecology

## 2014-07-28 ENCOUNTER — Other Ambulatory Visit: Payer: BC Managed Care – PPO

## 2014-07-28 ENCOUNTER — Ambulatory Visit: Payer: BC Managed Care – PPO | Admitting: Gynecology

## 2014-07-29 ENCOUNTER — Other Ambulatory Visit: Payer: BC Managed Care – PPO

## 2014-07-29 DIAGNOSIS — R7989 Other specified abnormal findings of blood chemistry: Secondary | ICD-10-CM

## 2014-07-29 LAB — THYROID PANEL WITH TSH
FREE THYROXINE INDEX: 2 (ref 1.0–3.9)
T3 UPTAKE: 39.8 % — AB (ref 22.5–37.0)
T4, Total: 4.9 ug/dL — ABNORMAL LOW (ref 5.0–12.5)
TSH: 1.559 u[IU]/mL (ref 0.350–4.500)

## 2014-07-31 ENCOUNTER — Other Ambulatory Visit: Payer: Self-pay | Admitting: Gynecology

## 2014-07-31 DIAGNOSIS — R7989 Other specified abnormal findings of blood chemistry: Secondary | ICD-10-CM

## 2014-09-25 ENCOUNTER — Other Ambulatory Visit: Payer: Self-pay

## 2014-10-12 ENCOUNTER — Encounter: Payer: Self-pay | Admitting: Gynecology

## 2014-12-09 ENCOUNTER — Telehealth: Payer: Self-pay

## 2014-12-09 ENCOUNTER — Ambulatory Visit (INDEPENDENT_AMBULATORY_CARE_PROVIDER_SITE_OTHER): Payer: BC Managed Care – PPO | Admitting: Gynecology

## 2014-12-09 ENCOUNTER — Other Ambulatory Visit: Payer: Self-pay | Admitting: Gynecology

## 2014-12-09 ENCOUNTER — Encounter: Payer: Self-pay | Admitting: Gynecology

## 2014-12-09 ENCOUNTER — Telehealth: Payer: Self-pay | Admitting: *Deleted

## 2014-12-09 ENCOUNTER — Other Ambulatory Visit (HOSPITAL_COMMUNITY)
Admission: RE | Admit: 2014-12-09 | Discharge: 2014-12-09 | Disposition: A | Discharge status: Home / Self Care | Payer: BLUE CROSS/BLUE SHIELD | Source: Ambulatory Visit | Attending: Gynecology | Admitting: Gynecology

## 2014-12-09 VITALS — BP 124/80

## 2014-12-09 DIAGNOSIS — N6452 Nipple discharge: Secondary | ICD-10-CM | POA: Insufficient documentation

## 2014-12-09 DIAGNOSIS — N644 Mastodynia: Secondary | ICD-10-CM

## 2014-12-09 DIAGNOSIS — N643 Galactorrhea not associated with childbirth: Secondary | ICD-10-CM

## 2014-12-09 DIAGNOSIS — O926 Galactorrhea: Secondary | ICD-10-CM

## 2014-12-09 NOTE — Telephone Encounter (Signed)
Patient informed. 

## 2014-12-09 NOTE — Telephone Encounter (Signed)
I scheduled patient with [facility] per Dr. Moshe Salisbury orders for Tuesday, 12/22/14 at 10:00am.  I tried to contact her to inform her but the only phone we have is her mobile and her voice mail is not set up. I will continue to try to reach her.

## 2014-12-09 NOTE — Addendum Note (Signed)
Addended by: Thurnell Garbe A on: 12/09/2014 12:46 PM   Modules accepted: Orders

## 2014-12-09 NOTE — Telephone Encounter (Signed)
ORDERS PLACED FOR BIL. DIAG. MAMMOGRAM AND ULTRASOUND

## 2014-12-09 NOTE — Progress Notes (Signed)
   Patient is a 48 year old who presented to the office today stating that in December 6 she twisted her ankle and landed on her right side in for approximately will one month she has had some tenderness on the right breast. She also has had physiological galactorrhea bilateral for quite some time but recently as yesterday she felt that it was bloody in appearance on her right nipple. She denied any unusual headaches or any visual disturbances. She scheduled to have the ParaGard IUD sometime next month. She stated that her grandmother had history of breast cancer and no other family member. Review of her medical record indicated her last mammogram was in 2010 she has put it off for quite some time.  Exam: Both breasts were examined in the supine and sitting position. There were pendulous. No unusual skin discoloration. No palpable masses or tenderness no supraclavicular axillary lymphadenopathy.  Patient squeezed her breasts and slight brownish discharge was noted from the right nipple and a slide was made and submitted for cytological evaluation.  Assessment/plan: Patient with bilateral galactorrhea now reporting brownish discharge from right nipple area. Patient has not had a mammogram since 2010. Patient will be scheduled for a diagnostic mammogram and ultrasound of the right breast and a screening mammogram of the left. We will also check a prolactin level. Patient has been having normal menstrual cycles.

## 2014-12-09 NOTE — Patient Instructions (Signed)
Galactorrhea Galactorrhea is when there is a milky nipple discharge. It is different from normal milk in nursing mothers. It usually comes from both nipples. Galactorrhea is not a disease but may be a symptom of a problem. It may continue for years after weaning. Galactorrhea is caused by the hormone prolactin, which stimulates milk production. If the breast discharge looks like pus, is bloody or if there is a lump present in the affected breast, the discharge may be caused by other problems including:  A benign cyst.  Papilloma.  Breast cancer.  A breast infection.  A breast abscess. It can also be seen in men who have a low or absent female hormone (testosterone) level. Galactorrhea can be present in a newborn if the mother had high female hormone (estrogen) levels that crossed into the baby through the placenta. The baby usually has enlarged breasts, but in time, it all goes away on its own. CAUSES   Tumor of the pituitary gland in the brain.  Problems with the hypothalamus in the brain that stimulates the pituitary gland.  Low thyroid function (hypothyroid disease).  Chronic kidney failure.  Medications, antidepressants, tranquilizers and blood pressure medication.  Herbal medications (nettle, fennel, blessed thistle, anise and fenugreek seed).  Illegal drugs (marijuana and opiates).  Breast stimulation during sexual activity or too many and frequent self breast exams.  Birth control pills.  Surgery or trauma to the breast causing nerve damage.  Spinal cord injury. SYMPTOMS   White, yellow or green discharge from one or both breasts.  No menstrual period (amenorrhea) or infrequent menstrual periods (hypomenorrhea).  Hot flashes, lack of sexual desire or vaginal dryness.  Infertility in women and men.  Headaches and vision problems.  Decrease in calcium in your bones (developing osteopenia or osteoporosis). DIAGNOSIS  Your caregiver may be able to know your problem  by taking a detailed history and physical exam of you. Tests that may be done, include:  Blood tests to check for the prolactin hormone, your female and thyroid hormones and a pregnancy test.  A detailed eye exam.  Mammogram.  X-rays, CT scan or MRI of breasts or your brain looking for a tumor. TREATMENT   Stopping medications that may be causing the galactorrhea.  Treating low thyroid function with thyroid hormones.  Medical or surgical (if necessary) treatment of a pituitary gland tumor.  Medication to lower the prolactin hormone level when no cause can be found.  Surgery as a last resort to remove the breasts ducts if the discharge persists with treatment and is a problem.  Treatment may not be necessary if you are not bothered by the breast discharge. HOME CARE INSTRUCTIONS   Before seeing your caregiver, make a list of all your symptoms, medications, when the breast discharge started and questions you may have.  Avoid breast stimulation during sexual activity.  Perform breast self exam once a month.  Avoid clothes that rub on your nipples.  Use breasts pads to absorb the discharge.  Wear a support bra. SEEK MEDICAL CARE IF:   You have galactorrhea and you are trying to get pregnant.  You develop hot flashes, vaginal dryness or lack of sexual desire.  You stop having menstrual periods or they are irregular or far apart.  You have headaches.  You have vision problems. SEEK IMMEDIATE MEDICAL CARE IF:   Your breast discharge is bloody or pus-like.  You have breast pain.  You feel a lump in your breast.  Your breast shows wrinkling or  dimpling.  Your breast becomes red and swollen. Document Released: 01/04/2005 Document Revised: 02/19/2012 Document Reviewed: 11/17/2008 [facility] Patient Information 2015 [place], [place]. This information is not intended to replace advice given to you by your health care provider. Make sure you discuss any questions you have  with your health care provider.

## 2014-12-10 LAB — PROLACTIN: Prolactin: 24.9 ng/mL

## 2014-12-22 ENCOUNTER — Other Ambulatory Visit: Payer: BC Managed Care – PPO

## 2015-01-14 ENCOUNTER — Other Ambulatory Visit: Payer: Self-pay | Admitting: Family Medicine

## 2015-01-14 ENCOUNTER — Ambulatory Visit
Admission: RE | Admit: 2015-01-14 | Discharge: 2015-01-14 | Disposition: A | Discharge status: Home / Self Care | Payer: BLUE CROSS/BLUE SHIELD | Source: Ambulatory Visit | Attending: Family Medicine | Admitting: Family Medicine

## 2015-01-14 DIAGNOSIS — R319 Hematuria, unspecified: Secondary | ICD-10-CM

## 2015-07-25 ENCOUNTER — Emergency Department (HOSPITAL_COMMUNITY)
Admission: EM | Admit: 2015-07-25 | Discharge: 2015-07-25 | Disposition: A | Discharge status: Home / Self Care | Payer: BLUE CROSS/BLUE SHIELD | Attending: Emergency Medicine | Admitting: Emergency Medicine

## 2015-07-25 ENCOUNTER — Encounter (HOSPITAL_COMMUNITY): Payer: Self-pay | Admitting: *Deleted

## 2015-07-25 ENCOUNTER — Emergency Department (HOSPITAL_COMMUNITY): Payer: BLUE CROSS/BLUE SHIELD

## 2015-07-25 ENCOUNTER — Emergency Department (HOSPITAL_COMMUNITY)
Admission: EM | Admit: 2015-07-25 | Discharge: 2015-07-25 | Disposition: A | Discharge status: Nursing Home (Includes ICF) | Payer: BLUE CROSS/BLUE SHIELD | Source: Home / Self Care | Attending: Family Medicine | Admitting: Family Medicine

## 2015-07-25 ENCOUNTER — Encounter (HOSPITAL_COMMUNITY): Payer: Self-pay | Admitting: Emergency Medicine

## 2015-07-25 DIAGNOSIS — F319 Bipolar disorder, unspecified: Secondary | ICD-10-CM | POA: Insufficient documentation

## 2015-07-25 DIAGNOSIS — Z7982 Long term (current) use of aspirin: Secondary | ICD-10-CM | POA: Diagnosis not present

## 2015-07-25 DIAGNOSIS — J45909 Unspecified asthma, uncomplicated: Secondary | ICD-10-CM | POA: Diagnosis not present

## 2015-07-25 DIAGNOSIS — Z720 Tobacco use: Secondary | ICD-10-CM | POA: Diagnosis not present

## 2015-07-25 DIAGNOSIS — M5442 Lumbago with sciatica, left side: Secondary | ICD-10-CM

## 2015-07-25 DIAGNOSIS — M5441 Lumbago with sciatica, right side: Secondary | ICD-10-CM | POA: Diagnosis not present

## 2015-07-25 DIAGNOSIS — M545 Low back pain: Secondary | ICD-10-CM | POA: Diagnosis present

## 2015-07-25 DIAGNOSIS — Z79899 Other long term (current) drug therapy: Secondary | ICD-10-CM | POA: Insufficient documentation

## 2015-07-25 DIAGNOSIS — M5432 Sciatica, left side: Secondary | ICD-10-CM | POA: Diagnosis not present

## 2015-07-25 DIAGNOSIS — Z7951 Long term (current) use of inhaled steroids: Secondary | ICD-10-CM | POA: Insufficient documentation

## 2015-07-25 DIAGNOSIS — M549 Dorsalgia, unspecified: Secondary | ICD-10-CM

## 2015-07-25 LAB — BASIC METABOLIC PANEL
ANION GAP: 8 (ref 5–15)
BUN: 8 mg/dL (ref 6–20)
CHLORIDE: 103 mmol/L (ref 101–111)
CO2: 27 mmol/L (ref 22–32)
CREATININE: 0.76 mg/dL (ref 0.44–1.00)
Calcium: 9.3 mg/dL (ref 8.9–10.3)
GFR calc non Af Amer: >60 mL/min (ref >60)
Glucose, Bld: 99 mg/dL (ref 65–99)
Potassium: 3.6 mmol/L (ref 3.5–5.1)
Sodium: 138 mmol/L (ref 135–145)

## 2015-07-25 LAB — CBC WITH DIFFERENTIAL/PLATELET
BASOS ABS: 0 10*3/uL (ref 0.0–0.1)
Basophils Relative: 0 % (ref 0–1)
Eosinophils Absolute: 0.2 10*3/uL (ref 0.0–0.7)
Eosinophils Relative: 1 % (ref 0–5)
HEMATOCRIT: 46.6 % — AB (ref 36.0–46.0)
HEMOGLOBIN: 15.1 g/dL — AB (ref 12.0–15.0)
LYMPHS ABS: 3.7 10*3/uL (ref 0.7–4.0)
Lymphocytes Relative: 26 % (ref 12–46)
MCH: 31.6 pg (ref 26.0–34.0)
MCHC: 32.4 g/dL (ref 30.0–36.0)
MCV: 97.5 fL (ref 78.0–100.0)
Monocytes Absolute: 1.2 10*3/uL — ABNORMAL HIGH (ref 0.1–1.0)
Monocytes Relative: 8 % (ref 3–12)
NEUTROS ABS: 9.2 10*3/uL — AB (ref 1.7–7.7)
Neutrophils Relative %: 65 % (ref 43–77)
Platelets: 371 10*3/uL (ref 150–400)
RBC: 4.78 MIL/uL (ref 3.87–5.11)
RDW: 13.4 % (ref 11.5–15.5)
WBC: 14.3 10*3/uL — ABNORMAL HIGH (ref 4.0–10.5)

## 2015-07-25 MED ORDER — DEXAMETHASONE SODIUM PHOSPHATE 10 MG/ML IJ SOLN
10.0000 mg | Freq: Once | INTRAMUSCULAR | Status: AC
  Administered 2015-07-25: 10 mg via INTRAVENOUS
  Filled 2015-07-25: qty 1

## 2015-07-25 MED ORDER — OXYCODONE-ACETAMINOPHEN 5-325 MG PO TABS: 2.0000 | ORAL_TABLET | ORAL | Status: DC | PRN

## 2015-07-25 MED ORDER — METHYLPREDNISOLONE 4 MG PO TBPK: ORAL_TABLET | ORAL | Status: DC

## 2015-07-25 MED ORDER — GADOBENATE DIMEGLUMINE 529 MG/ML IV SOLN
20.0000 mL | Freq: Once | INTRAVENOUS | Status: AC | PRN
  Administered 2015-07-25: 20 mL via INTRAVENOUS

## 2015-07-25 MED ORDER — HYDROMORPHONE HCL 1 MG/ML IJ SOLN
1.0000 mg | INTRAMUSCULAR | Status: DC | PRN
  Administered 2015-07-25: 1 mg via INTRAVENOUS
  Filled 2015-07-25: qty 1

## 2015-07-25 MED ORDER — ONDANSETRON HCL 4 MG/2ML IJ SOLN
4.0000 mg | Freq: Once | INTRAMUSCULAR | Status: AC
  Administered 2015-07-25: 4 mg via INTRAVENOUS
  Filled 2015-07-25: qty 2

## 2015-07-25 NOTE — Discharge Instructions (Signed)
Sciatica Sciatica is pain, weakness, numbness, or tingling along the path of the sciatic nerve. The nerve starts in the lower back and runs down the back of each leg. The nerve controls the muscles in the lower leg and in the back of the knee, while also providing sensation to the back of the thigh, lower leg, and the sole of your foot. Sciatica is a symptom of another medical condition. For instance, nerve damage or certain conditions, such as a herniated disk or bone spur on the spine, pinch or put pressure on the sciatic nerve. This causes the pain, weakness, or other sensations normally associated with sciatica. Generally, sciatica only affects one side of the body. CAUSES   Herniated or slipped disc.  Degenerative disk disease.  A pain disorder involving the narrow muscle in the buttocks (piriformis syndrome).  Pelvic injury or fracture.  Pregnancy.  Tumor (rare). SYMPTOMS  Symptoms can vary from mild to very severe. The symptoms usually travel from the low back to the buttocks and down the back of the leg. Symptoms can include:  Mild tingling or dull aches in the lower back, leg, or hip.  Numbness in the back of the calf or sole of the foot.  Burning sensations in the lower back, leg, or hip.  Sharp pains in the lower back, leg, or hip.  Leg weakness.  Severe back pain inhibiting movement. These symptoms may get worse with coughing, sneezing, laughing, or prolonged sitting or standing. Also, being overweight may worsen symptoms. DIAGNOSIS  Your caregiver will perform a physical exam to look for common symptoms of sciatica. He or she may ask you to do certain movements or activities that would trigger sciatic nerve pain. Other tests may be performed to find the cause of the sciatica. These may include:  Blood tests.  X-rays.  Imaging tests, such as an MRI or CT scan. TREATMENT  Treatment is directed at the cause of the sciatic pain. Sometimes, treatment is not necessary  and the pain and discomfort goes away on its own. If treatment is needed, your caregiver may suggest:  Over-the-counter medicines to relieve pain.  Prescription medicines, such as anti-inflammatory medicine, muscle relaxants, or narcotics.  Applying heat or ice to the painful area.  Steroid injections to lessen pain, irritation, and inflammation around the nerve.  Reducing activity during periods of pain.  Exercising and stretching to strengthen your abdomen and improve flexibility of your spine. Your caregiver may suggest losing weight if the extra weight makes the back pain worse.  Physical therapy.  Surgery to eliminate what is pressing or pinching the nerve, such as a bone spur or part of a herniated disk. HOME CARE INSTRUCTIONS   Only take over-the-counter or prescription medicines for pain or discomfort as directed by your caregiver.  Apply ice to the affected area for 20 minutes, 3-4 times a day for the first 48-72 hours. Then try heat in the same way.  Exercise, stretch, or perform your usual activities if these do not aggravate your pain.  Attend physical therapy sessions as directed by your caregiver.  Keep all follow-up appointments as directed by your caregiver.  Do not wear high heels or shoes that do not provide proper support.  Check your mattress to see if it is too soft. A firm mattress may lessen your pain and discomfort. SEEK IMMEDIATE MEDICAL CARE IF:   You lose control of your bowel or bladder (incontinence).  You have increasing weakness in the lower back, pelvis, buttocks,   or legs.  You have redness or swelling of your back.  You have a burning sensation when you urinate.  You have pain that gets worse when you lie down or awakens you at night.  Your pain is worse than you have experienced in the past.  Your pain is lasting longer than 4 weeks.  You are suddenly losing weight without reason. MAKE SURE YOU:  Understand these  instructions.  Will watch your condition.  Will get help right away if you are not doing well or get worse. Document Released: 11/21/2001 Document Revised: 05/28/2012 Document Reviewed: 04/07/2012 [employer] Patient Information 2015 [employer]. This information is not intended to replace advice given to you by your health care provider. Make sure you discuss any questions you have with your health care provider.  

## 2015-07-25 NOTE — ED Notes (Signed)
Pt reports severe back pain and bilateral leg pain and weakness. Pt reports difficulty walking and tingling in her legs. Pt reports spinal fusion on L4 and L5. Pt states that she was seen by here surgeon and received a steroid shot. Pt states that that is not helping. Pt denies bowel or bladder changes.

## 2015-07-25 NOTE — ED Notes (Signed)
Reports severe back pain.  Patient has a history of back pain and had back surgery in [place] in may 2016.  Patient reports over the last 3 weeks has felt pain, over the past 3 days pain has been significant and now having difficulty walking per patient.  Reports bilateral hip pain, left worse than right and hip pain is new for her.  Denies any bowel or bladder issues.

## 2015-07-25 NOTE — ED Provider Notes (Signed)
CSN: [id number]     Arrival date & time 07/25/15  1329 History   First MD Initiated Contact with Patient 07/25/15 1353     Chief Complaint  Patient presents with  . Back Pain   (Consider location/radiation/quality/duration/timing/severity/associated sxs/prior Treatment) Patient is a 49 y.o. female presenting with back pain. The history is provided by the patient and the spouse.  Back Pain Location:  Lumbar spine Quality:  Shooting Radiates to:  R posterior upper leg and L posterior upper leg Pain severity:  Moderate Onset quality:  Gradual Duration:  3 days Progression:  Worsening Chronicity:  Recurrent Context comment:  23mo [facility] [place], seen 3 wks ago in f/u doing well, progressive worsening since with pain in both hips, no gu or gu sx. pain and reported weakness with walking. Relieved by:  Nothing Associated symptoms: numbness, paresthesias and weakness   Associated symptoms: no abdominal pain, no abdominal swelling, no bladder incontinence, no bowel incontinence, no dysuria, no fever and no pelvic pain   Risk factors: recent surgery     Past Medical History  Diagnosis Date  . Bipolar disorder   . Asthma   . PMDD (premenstrual dysphoric disorder)   . Alcoholism   . CIN I (cervical intraepithelial neoplasia I)   . PTSD (post-traumatic stress disorder)   . PCOS (polycystic ovarian syndrome)    Past Surgical History  Procedure Laterality Date  . Cervical biopsy  w/ loop electrode excision  1995    CIN l  . Colposcopy  1995    CIN l  . Mouth surgery    . Tonsilectomy, adenoidectomy, bilateral myringotomy and tubes    . Nasal septum surgery     Family History  Problem Relation Age of Onset  . Breast cancer Mother   . Hypertension Father   . Cancer Father     prostate  . Diabetes Maternal Grandmother   . Bipolar disorder Maternal Grandmother   . Breast cancer Paternal Grandmother   . Bipolar disorder Paternal Grandmother   .  Bipolar disorder Maternal Grandfather   . Bipolar disorder Paternal Grandfather    Social History  Substance Use Topics  . Smoking status: Current Every Day Smoker -- 1.50 packs/day    Types: Cigarettes  . Smokeless tobacco: None  . Alcohol Use: 0.0 oz/week    0 Standard drinks or equivalent per week     Comment: rare   OB History    Gravida Para Term Preterm AB TAB SAB Ectopic Multiple Living   1    1          Review of Systems  Constitutional: Negative for fever.  Gastrointestinal: Negative.  Negative for abdominal pain and bowel incontinence.  Genitourinary: Negative.  Negative for bladder incontinence, dysuria and pelvic pain.  Musculoskeletal: Positive for back pain.  Neurological: Positive for weakness, numbness and paresthesias.    Allergies  Lamictal  Home Medications   Prior to Admission medications   Medication Sig Start Date End Date Taking? Authorizing Provider  ARIPiprazole (ABILIFY) 30 MG tablet Take 15 mg by mouth daily.     Historical Provider, MD  beclomethasone (QVAR) 80 MCG/ACT inhaler Inhale 1 puff into the lungs as needed.    Historical Provider, MD  buPROPion (WELLBUTRIN XL) 300 MG 24 hr tablet Take 300 mg by mouth daily.    Historical Provider, MD  busPIRone (BUSPAR) 15 MG tablet Take 15 mg by mouth 3 (three) times daily.    Historical  Provider, MD  etodolac (LODINE) 200 MG capsule Take 200 mg by mouth every 8 (eight) hours.    Historical Provider, MD  fluticasone (FLONASE) 50 MCG/ACT nasal spray Place 2 sprays into the nose daily.      Historical Provider, MD  gabapentin (NEURONTIN) 600 MG tablet Take 600 mg by mouth 3 (three) times daily.    Historical Provider, MD  ibuprofen (ADVIL,MOTRIN) 200 MG tablet Take 200 mg by mouth every 6 (six) hours as needed.    Historical Provider, MD  Levalbuterol HCl (XOPENEX IN) Inhale into the lungs.      Historical Provider, MD  lisdexamfetamine (VYVANSE) 20 MG capsule Take 20 mg by mouth daily.    Historical  Provider, MD  Lurasidone HCl (LATUDA) 120 MG TABS Take by mouth.    Historical Provider, MD  meclizine (ANTIVERT) 25 MG tablet Take 25 mg by mouth 3 (three) times daily as needed.    Historical Provider, MD  metFORMIN (GLUCOPHAGE) 500 MG tablet Take 500 mg by mouth 2 (two) times daily with a meal.      Historical Provider, MD  metoprolol succinate (TOPROL-XL) 50 MG 24 hr tablet Take 50 mg by mouth daily. Take with or immediately following a meal.    Historical Provider, MD  Montelukast Sodium (SINGULAIR PO) Take by mouth.      Historical Provider, MD  ondansetron (ZOFRAN) 4 MG tablet Take 4 mg by mouth every 8 (eight) hours as needed.    Historical Provider, MD  Pseudoephedrine HCl (SUDAFED 12 HOUR PO) Take by mouth.      Historical Provider, MD  terconazole (TERAZOL 3) 0.8 % vaginal cream Place 1 applicator vaginally at bedtime. Patient not taking: Reported on 12/09/2014 10/03/12   Huel Cote, NP  topiramate (TOPAMAX) 25 MG capsule Take 25 mg by mouth 2 (two) times daily.    Historical Provider, MD  tranexamic acid (LYSTEDA) 650 MG TABS tablet Take two tablets thre times a day for five days during menses Patient not taking: Reported on 12/09/2014 03/11/14   Terrance Mass, MD  traZODone (DESYREL) 150 MG tablet Take by mouth at bedtime.    Historical Provider, MD   BP 119/81 mmHg  Pulse 79  Temp(Src) 98.5 F (36.9 C) (Oral)  Resp 16  SpO2 98% Physical Exam  Constitutional: She is oriented to person, place, and time. She appears well-developed and well-nourished. No distress.  In wheelchair, using cane at her side.  Abdominal: Soft. Bowel sounds are normal.  Musculoskeletal: She exhibits tenderness.  Neg slr, nl ehl sens and strength, pulses 2+, walking was not attempted.  Neurological: She is alert and oriented to person, place, and time.  Skin: Skin is warm and dry.  Nursing note and vitals reviewed.   ED Course  Procedures (including critical care time) Labs Review Labs  Reviewed - No data to display  Imaging Review No results found.   MDM   1. Bilateral low back pain with sciatica, sciatica laterality unspecified        Billy Fischer, MD 07/25/15 1426

## 2015-07-25 NOTE — ED Notes (Addendum)
Spoke with pt regarding wait.  Pt concerned that she has seen other patients going to x-ray and she has not been.  Explained triage protocols to patient and informed her that MD would have to examine her and decide if x-rays were needed.  Pt verbalized understanding.  RN apologized for wait.

## 2015-07-26 NOTE — ED Provider Notes (Signed)
CSN: [id number]     Arrival date & time 07/25/15  1442 History   First MD Initiated Contact with Patient 07/25/15 1840     Chief Complaint  Patient presents with  . Back Pain      HPI  Patient presents for evaluation of back pain and leg pain. History of L4-5 lumbar interbody fusion and right laminectomy done at [facility] in [facility] May 30. Had improvement in symptoms initially. Over the last 3 weeks and had progression recurrence of back pain. Now has pain radiating down her left leg posteriorly. Is somewhat equivocal but states that she "sometimes" has pain radiating to her left anterior thigh. Continues to have weakness of her left leg and a "foot drop" that she states she has had since her surgery. No bowel or bladder changes. No perineal sensation changes.  Past Medical History  Diagnosis Date  . Bipolar disorder   . Asthma   . PMDD (premenstrual dysphoric disorder)   . Alcoholism   . CIN I (cervical intraepithelial neoplasia I)   . PTSD (post-traumatic stress disorder)   . PCOS (polycystic ovarian syndrome)    Past Surgical History  Procedure Laterality Date  . Cervical biopsy  w/ loop electrode excision  1995    CIN l  . Colposcopy  1995    CIN l  . Mouth surgery    . Tonsilectomy, adenoidectomy, bilateral myringotomy and tubes    . Nasal septum surgery     Family History  Problem Relation Age of Onset  . Breast cancer Mother   . Hypertension Father   . Cancer Father     prostate  . Diabetes Maternal Grandmother   . Bipolar disorder Maternal Grandmother   . Breast cancer Paternal Grandmother   . Bipolar disorder Paternal Grandmother   . Bipolar disorder Maternal Grandfather   . Bipolar disorder Paternal Grandfather    Social History  Substance Use Topics  . Smoking status: Current Every Day Smoker -- 1.50 packs/day    Types: Cigarettes  . Smokeless tobacco: None  . Alcohol Use: 0.0 oz/week    0 Standard drinks or equivalent per week     Comment: rare    OB History    Gravida Para Term Preterm AB TAB SAB Ectopic Multiple Living   1    1          Review of Systems  Constitutional: Negative for fever, chills, diaphoresis, appetite change and fatigue.  HENT: Negative for mouth sores, sore throat and trouble swallowing.   Eyes: Negative for visual disturbance.  Respiratory: Negative for cough, chest tightness, shortness of breath and wheezing.   Cardiovascular: Negative for chest pain.  Gastrointestinal: Negative for nausea, vomiting, abdominal pain, diarrhea and abdominal distention.  Endocrine: Negative for polydipsia, polyphagia and polyuria.  Genitourinary: Negative for dysuria, frequency and hematuria.  Musculoskeletal: Positive for myalgias, back pain, arthralgias and gait problem.  Skin: Negative for color change, pallor and rash.  Neurological: Negative for dizziness, syncope, light-headedness and headaches.  Hematological: Does not bruise/bleed easily.  Psychiatric/Behavioral: Negative for behavioral problems and confusion.      Allergies  Bee venom and Lamictal  Home Medications   Prior to Admission medications   Medication Sig Start Date End Date Taking? Authorizing Provider  acetaminophen-codeine (TYLENOL #3) 300-30 MG per tablet Take 1 tablet by mouth at bedtime as needed (back pain).  07/15/15  Yes Historical Provider, MD  aspirin-acetaminophen-caffeine (EXCEDRIN MIGRAINE) [phone] MG per tablet Take 2 tablets by mouth  2 (two) times daily as needed (back pain).   Yes Historical Provider, MD  atomoxetine (STRATTERA) 80 MG capsule Take 80 mg by mouth daily. 06/10/15 06/09/16 Yes Historical Provider, MD  buPROPion (WELLBUTRIN XL) 150 MG 24 hr tablet Take 150 mg by mouth daily. 07/23/15  Yes Historical Provider, MD  busPIRone (BUSPAR) 15 MG tablet Take 15 mg by mouth 2 (two) times daily.    Yes Historical Provider, MD  fluticasone (FLONASE) 50 MCG/ACT nasal spray Place 2 sprays into both nostrils daily as needed for  allergies or rhinitis.    Yes Historical Provider, MD  Fluticasone Furoate-Vilanterol (BREO ELLIPTA) 100-25 MCG/INH AEPB Inhale 1 puff into the lungs daily.   Yes Historical Provider, MD  ibuprofen (ADVIL,MOTRIN) 200 MG tablet Take 800 mg by mouth daily as needed (pain).    Yes Historical Provider, MD  levalbuterol (XOPENEX HFA) 45 MCG/ACT inhaler Inhale 2 puffs into the lungs every 4 (four) hours as needed (asthma symptoms).   Yes Historical Provider, MD  lithium carbonate 300 MG capsule Take 600 mg by mouth at bedtime.  06/03/15  Yes Historical Provider, MD  lurasidone (LATUDA) 80 MG TABS tablet Take 160 mg by mouth at bedtime.   Yes Historical Provider, MD  meclizine (ANTIVERT) 25 MG tablet Take 25 mg by mouth 2 (two) times daily as needed for dizziness (motion sickness).    Yes Historical Provider, MD  metFORMIN (GLUCOPHAGE) 500 MG tablet Take 500 mg by mouth 2 (two) times daily with a meal.     Yes Historical Provider, MD  metoprolol succinate (TOPROL-XL) 25 MG 24 hr tablet Take 25 mg by mouth 2 (two) times daily.  07/09/15  Yes Historical Provider, MD  montelukast (SINGULAIR) 10 MG tablet Take 10 mg by mouth at bedtime.  07/07/15  Yes Historical Provider, MD  sertraline (ZOLOFT) 100 MG tablet Take 150 mg by mouth daily.  07/07/15  Yes Historical Provider, MD  SUMAtriptan (IMITREX) 50 MG tablet Take 50 mg by mouth every 2 (two) hours as needed for migraine. May repeat in 2 hours if headache persists or recurs.   Yes Historical Provider, MD  TIZANIDINE HCL PO Take 1 tablet by mouth at bedtime as needed (back muscle spasms).   Yes Historical Provider, MD  topiramate (TOPAMAX) 50 MG tablet Take 50 mg by mouth at bedtime.  07/15/15  Yes Historical Provider, MD  traZODone (DESYREL) 100 MG tablet Take 200 mg by mouth at bedtime.  05/19/15  Yes Historical Provider, MD  methylPREDNISolone (MEDROL DOSEPAK) 4 MG TBPK tablet 6 po on day 1, then decrease by 1 per day 07/25/15   Tanna Furry, MD   oxyCODONE-acetaminophen (PERCOCET/ROXICET) 5-325 MG per tablet Take 2 tablets by mouth every 4 (four) hours as needed. 07/25/15   Tanna Furry, MD   BP 118/63 mmHg  Pulse 72  Temp(Src) 98.6 F (37 C) (Oral)  Resp 16  Ht 5\' 6"  (1.676 m)  Wt 225 lb (102.059 kg)  BMI 36.33 kg/m2  SpO2 98% Physical Exam  Constitutional: She is oriented to person, place, and time. She appears well-developed and well-nourished. No distress.  HENT:  Head: Normocephalic.  Eyes: Conjunctivae are normal. Pupils are equal, round, and reactive to light. No scleral icterus.  Neck: Normal range of motion. Neck supple. No thyromegaly present.  Cardiovascular: Normal rate and regular rhythm.  Exam reveals no gallop and no friction rub.   No murmur heard. Pulmonary/Chest: Effort normal and breath sounds normal. No respiratory distress. She has no  wheezes. She has no rales.  Abdominal: Soft. Bowel sounds are normal. She exhibits no distension. There is no tenderness. There is no rebound.  Musculoskeletal: Normal range of motion.  Neurological: She is alert and oriented to person, place, and time.  Normal symmetric Strength to shoulder shrug, triceps, biceps, grip,wrist flex/extend,and intrinsics  Norma symmetric sensation above and below clavicles, and to all distributions to UEs. Norma symmetric strength to flex/.extend hip and knees, .  Slight weakness to dorsiflexion on left. Normal symmetric sensation to all distributions to LEs Patellar and achilles reflexes 1-2+. Downgoing Babinski   Skin: Skin is warm and dry. No rash noted.  Psychiatric: She has a normal mood and affect. Her behavior is normal.    ED Course  Procedures (including critical care time) Labs Review Labs Reviewed  CBC WITH DIFFERENTIAL/PLATELET - Abnormal; Notable for the following:    WBC 14.3 (*)    Hemoglobin 15.1 (*)    HCT 46.6 (*)    Neutro Abs 9.2 (*)    Monocytes Absolute 1.2 (*)    All other components within normal limits  BASIC  METABOLIC PANEL    Imaging Review Mr Lumbar Spine W Wo Contrast  07/25/2015   CLINICAL DATA:  Severe low back pain. Bilateral hip pain, worse on the left. Lumbar spine surgery in [facility] 2016.  EXAM: MRI LUMBAR SPINE WITHOUT AND WITH CONTRAST  TECHNIQUE: Multiplanar and multiecho pulse sequences of the lumbar spine were obtained without and with intravenous contrast.  CONTRAST:  75mL MULTIHANCE GADOBENATE DIMEGLUMINE 529 MG/ML IV SOLN  COMPARISON:  CT of the abdomen and pelvis 01/14/2015.  FINDINGS: Normal signal is present in the conus medullaris which terminates at L2. Lumbar fusion is present at L4-5. Pedicle screws are present on the right. Marrow signal, vertebral body heights, and alignment are otherwise normal.  The disc levels at L2-3 and above are normal.  L3-4:  Negative.  L4-5: A right laminectomy is present. No residual or recurrent stenosis is present.  L5-S1:  Negative.  IMPRESSION: 1. Lumbar fusion and right laminectomy at L4-5 without residual or recurrent stenosis. 2. No other significant disc protrusion or focal stenosis.   Electronically Signed   By: San Morelle M.D.   On: 07/25/2015 21:01   I, Jeneen Rinks, Simrah Chatham JOSEPH, personally reviewed and evaluated these images and lab results as part of my medical decision-making.   EKG Interpretation None      MDM   Final diagnoses:  Back pain  Sciatica, left    MRI reassuring. No fluid collections. No sign of abscess or infection. No obvious acute herniations, or postsurgical complications.  Plan is treatment for sciatica. Steroids, pain medication, primary care follow-up regarding physical therapy. Her symptoms have likely come about because she states she is walking differently. She continues to have a foot drop on her left leg although she states it is improving since surgery.    Tanna Furry, MD 07/26/15 [id number]

## 2015-09-20 ENCOUNTER — Ambulatory Visit (INDEPENDENT_AMBULATORY_CARE_PROVIDER_SITE_OTHER): Payer: BLUE CROSS/BLUE SHIELD | Admitting: Neurology

## 2015-09-20 ENCOUNTER — Encounter: Payer: Self-pay | Admitting: Neurology

## 2015-09-20 VITALS — BP 138/78 | HR 76 | Ht 66.0 in | Wt 218.0 lb

## 2015-09-20 DIAGNOSIS — F319 Bipolar disorder, unspecified: Secondary | ICD-10-CM

## 2015-09-20 DIAGNOSIS — R269 Unspecified abnormalities of gait and mobility: Secondary | ICD-10-CM | POA: Diagnosis not present

## 2015-09-20 DIAGNOSIS — Z9889 Other specified postprocedural states: Secondary | ICD-10-CM

## 2015-09-20 NOTE — Progress Notes (Signed)
PATIENT: Angelica [name] DOB: Jul 24, 1966  Chief Complaint  Patient presents with  . Back Pain    She had a lumbar fusion in May 2016.  Since her surgery, she has been having gait difficulty and is unable to walk without a cane.  Says her abnormal gait is causing her legs and feet to hurt.  . Tremors    She has intermittent bilateral hand tremors that sometimes make it hard for her to eat and write.     HISTORICAL  Angelica [name] is a 49 years old left-handed female,, seen in refer by her primary care physician Dr. Audley Hose and pain management Dr. Dian Situ for evaluation of bilateral hands tremor  I have reviewed and summarized her most recent office visit, she has past medical history of PTSD, bipolar disorder, on polypharmacy treatment, including Wellbutrin, lithium, Zoloft, Topamax, chronic migraine headaches,  She had lumbar decompression surgery in May 2016 by Dr. Laverta Baltimore at [facility],  she choose to have surgery at [facility] to be close to her parents, prior to the surgery, she complains of chronic low back pain for more than a decade, radiating pain to right lower extremity, failed to repeat epidural injection,  Surgery did help her low back pain, but she continue have gait difficulty, which is a continuation of her chronic problem, she is now having mild right lateral thigh and deep achy pain, denied left leg symptoms, denied bowel and bladder incontinence, but she continue have unsteady gait," as if I have forgot what is like to walk normal"  She also complains of few years history of bilateral hands tremor, intermittent, denies family history of tremor, was diagnosed with bipolar disorder since 23, was treated with Lithium at variable time  I have reviewed EMG nerve conduction study in September 20 second 2016 [facility], bilateral peroneal, and tibial motor responses were normal. Bilateral tibial H  reflexes were normal and symmetric, left superficial peroneal sensory response was absent. Right superficial peroneal sensory response was normal. Bilateral sural sensory responses were normal. Left peroneal to EDB motor response showed normal distal latency, C map amplitude, conduction velocity, with absent F-wave latency.  Selected needle examination of bilateral lower extremity muscles and bilateral lumbar sacral paraspinal muscles were normal I have personally reviewed MRI of lumbar in August 2016: 1. Lumbar fusion and right laminectomy at L4-5 without residual or recurrent stenosis. No other significant disc protrusion or focal stenosis.   REVIEW OF SYSTEMS: Full 14 system review of systems performed and notable only for as above  ALLERGIES: Allergies  Allergen Reactions  . Bee Venom Itching, Swelling and Other (See Comments)    Flu like symptoms  . Lamictal [Lamotrigine] Rash    HOME MEDICATIONS: Current Outpatient Prescriptions  Medication Sig Dispense Refill  . aspirin-acetaminophen-caffeine (EXCEDRIN MIGRAINE) 250-250-65 MG per tablet Take 2 tablets by mouth 2 (two) times daily as needed (back pain).    Marland Kitchen atomoxetine (STRATTERA) 80 MG capsule Take 80 mg by mouth daily.    Marland Kitchen BREO ELLIPTA 200-25 MCG/INH AEPB   4  . buPROPion (WELLBUTRIN XL) 150 MG 24 hr tablet Take 150 mg by mouth daily.  2  . busPIRone (BUSPAR) 15 MG tablet Take 15 mg by mouth 2 (two) times daily.     . fluticasone (FLONASE) 50 MCG/ACT nasal spray Place 2 sprays into both nostrils daily as needed for allergies or rhinitis.     . Fluticasone  Furoate-Vilanterol (BREO ELLIPTA) 100-25 MCG/INH AEPB Inhale 1 puff into the lungs daily.    Marland Kitchen levalbuterol (XOPENEX HFA) 45 MCG/ACT inhaler Inhale 2 puffs into the lungs every 4 (four) hours as needed (asthma symptoms).    Marland Kitchen lithium carbonate 300 MG capsule Take 600 mg by mouth at bedtime.   1  . lurasidone (LATUDA) 80 MG TABS tablet Take 160 mg by mouth at bedtime.    .  meclizine (ANTIVERT) 25 MG tablet Take 25 mg by mouth 2 (two) times daily as needed for dizziness (motion sickness).     . metFORMIN (GLUCOPHAGE) 500 MG tablet Take 500 mg by mouth 2 (two) times daily with a meal.      . metoprolol succinate (TOPROL-XL) 25 MG 24 hr tablet Take 25 mg by mouth 2 (two) times daily.   3  . montelukast (SINGULAIR) 10 MG tablet Take 10 mg by mouth at bedtime.   10  . sertraline (ZOLOFT) 100 MG tablet Take 150 mg by mouth daily.   10  . SUMAtriptan (IMITREX) 50 MG tablet Take 50 mg by mouth every 2 (two) hours as needed for migraine. May repeat in 2 hours if headache persists or recurs.    Marland Kitchen TIZANIDINE HCL PO Take 1 tablet by mouth at bedtime as needed (back muscle spasms).    . topiramate (TOPAMAX) 50 MG tablet Take 50 mg by mouth at bedtime.   2  . traZODone (DESYREL) 100 MG tablet Take 200 mg by mouth at bedtime.   2   Current Facility-Administered Medications  Medication Dose Route Frequency Provider Last Rate Last Dose  . levonorgestrel (MIRENA) 20 MCG/24HR IUD   Intrauterine Once Angelica Mass, MD        PAST MEDICAL HISTORY: Past Medical History  Diagnosis Date  . Bipolar disorder ([place])   . Asthma   . PMDD (premenstrual dysphoric disorder)   . Alcoholism ([place])   . CIN I (cervical intraepithelial neoplasia I)   . PTSD (post-traumatic stress disorder)   . PCOS (polycystic ovarian syndrome)   . Hypertension   . Migraine   . Anxiety   . ADHD (attention deficit hyperactivity disorder)   . COPD (chronic obstructive pulmonary disease) ([place])   . Barrett's esophagus   . Vertigo   . Back pain     PAST SURGICAL HISTORY: Past Surgical History  Procedure Laterality Date  . Cervical biopsy  w/ loop electrode excision  1995    CIN l  . Colposcopy  1995    CIN l  . Mouth surgery    . Tonsilectomy, adenoidectomy, bilateral myringotomy and tubes    . Nasal septum surgery    . Back surgery      Spinal fusion - lumbar 2016    FAMILY HISTORY: Family  History  Problem Relation Age of Onset  . Breast cancer Mother   . Hypertension Father   . Cancer Father     prostate  . Diabetes Maternal Grandmother   . Bipolar disorder Maternal Grandmother   . Breast cancer Paternal Grandmother   . Bipolar disorder Paternal Grandmother   . Bipolar disorder Maternal Grandfather   . Bipolar disorder Paternal Grandfather     SOCIAL HISTORY:  Social History   Social History  . Marital Status: Divorced    Spouse Name: N/A  . Number of Children: 0  . Years of Education: 16   Occupational History  . Student    Social History Main Topics  . Smoking status:  Current Every Day Smoker -- 1.50 packs/day    Types: Cigarettes  . Smokeless tobacco: Not on file  . Alcohol Use: 0.0 oz/week    0 Standard drinks or equivalent per week     Comment: Rarely - only drinks once or twice per year. Previous problem with alcohol.  . Drug Use: Yes     Comment: Previously problem with overuse of pain meds and clonazepam.  . Sexual Activity: Not Currently   Other Topics Concern  . Not on file   Social History Narrative   Lives at home alone.   Left-handed.   3-5 cups caffeine per day.     PHYSICAL EXAM   Filed Vitals:   09/20/15 1505  BP: 138/78  Pulse: 76  Height: 5\' 6"  (1.676 m)  Weight: 218 lb (98.884 kg)    Not recorded      Body [name] index is 35.2 kg/(m^2).  PHYSICAL EXAMNIATION:  Gen: NAD, conversant, well nourised, obese, well groomed                     Cardiovascular: Regular rate rhythm, no peripheral edema, warm, nontender. Eyes: Conjunctivae clear without exudates or hemorrhage Neck: Supple, no carotid bruise. Pulmonary: Clear to auscultation bilaterally   NEUROLOGICAL EXAM:  MENTAL STATUS: Speech:    Speech is normal; fluent and spontaneous with normal comprehension.  Cognition:     Orientation to time, place and person     Normal recent and remote memory     Normal Attention span and concentration     Normal  Language, naming, repeating,spontaneous speech     Fund of knowledge   CRANIAL NERVES: CN II: Visual fields are full to confrontation. Fundoscopic exam is normal with sharp discs and no vascular changes. Pupils are round equal and briskly reactive to light. CN III, IV, VI: extraocular movement are normal. No ptosis. CN V: Facial sensation is intact to pinprick in all 3 divisions bilaterally. Corneal responses are intact.  CN VII: Face is symmetric with normal eye closure and smile. CN VIII: Hearing is normal to rubbing fingers CN IX, X: Palate elevates symmetrically. Phonation is normal. CN XI: Head turning and shoulder shrug are intact CN XII: Tongue is midline with normal movements and no atrophy.  MOTOR: Muscle bulk and tone are normal. Muscle strength is normal. She has mild bilateral hands postural tremor  REFLEXES: Reflexes are 2+ and symmetric at the biceps, triceps, knees, and ankles. Plantar responses are flexor.  SENSORY: Intact to light touch, pinprick, position sense, and vibration sense are intact in fingers and toes.  COORDINATION: Rapid alternating movements and fine finger movements are intact. There is no dysmetria on finger-to-nose and heel-knee-shin.    GAIT/STANCE: She needs push up to get up from seated position, cautious, exaggerated posturing, mildly unsteady   DIAGNOSTIC DATA (LABS, IMAGING, TESTING) - I reviewed patient records, labs, notes, testing and imaging myself where available.   ASSESSMENT AND PLAN  PASSION LAVIN is a 49 y.o. female   Gait difficulty  Multifactorial, variable effort from patient, contributing factor also includes deconditioning, low back pain, right leg pain,  I do not see any evidence of lower extremity weakness, no upper motor neuron signs,  I will refer her to physical therapy encouraged her moderate daily exercise Bilateral hands tremor  Exaggerated physiological tremor, lithium side effect,    Marcial Pacas, M.D.  Ph.D.  [facility] [address] [place], [place] [address] Ph: [phone]  Fax: [phone]  ZS:MOLMBE Stephanie Acre, MD, Dr. Dian Situ

## 2015-10-04 ENCOUNTER — Ambulatory Visit: Payer: BLUE CROSS/BLUE SHIELD | Attending: Neurology | Admitting: Rehabilitation

## 2015-10-04 ENCOUNTER — Encounter: Payer: Self-pay | Admitting: Rehabilitation

## 2015-10-04 DIAGNOSIS — M5441 Lumbago with sciatica, right side: Secondary | ICD-10-CM | POA: Diagnosis present

## 2015-10-04 DIAGNOSIS — R269 Unspecified abnormalities of gait and mobility: Secondary | ICD-10-CM | POA: Diagnosis not present

## 2015-10-04 DIAGNOSIS — R531 Weakness: Secondary | ICD-10-CM | POA: Diagnosis present

## 2015-10-04 NOTE — Patient Instructions (Signed)
PELVIC TILT: Posterior    Tighten abdominals, flatten low back. Hold for 3-5 secs. _10__ reps per set, _2__ sets per day, _5__ days per week   Copyright  VHI. All rights reserved.     In same position as above, flatten lower back, lift right leg then left leg, then relax pelvic.  Repeat 10 times.  Make sure you are breathing!!  Do 2 sets per day, 5 days a week  Abduction: Clam (Eccentric) - Side-Lying    Lie on side with knees bent. Lift top knee, keeping feet together. Keep trunk steady (make sure it doesn't . Slowly lower for 3-5 seconds. _10__ reps per set, __2_ sets per day, _5__ days per week.  http://ecce.exer.us/65   Copyright  VHI. All rights reserved.   Knee to Chest    Pull one knee to chest until a stretch is felt in the lower back and buttock. Repeat on other side. Hold __60__ seconds each side. Repeat __2__ times. Do __2__ sessions per day.  Copyright  VHI. All rights reserved.   Hamstring: Towel Stretch (Supine)    Lie on back. Loop towel around left foot, hip and knee at 90. Straighten knee and pull foot toward body. Hold _60__ seconds. Relax. Repeat _2__ times. Do _2__ times a day. Repeat with other leg.    Copyright  VHI. All rights reserved.

## 2015-10-04 NOTE — Therapy (Signed)
[facility] [address] [place], [place], [address] Phone: [phone]   Fax:  [phone]  Physical Therapy Evaluation  Patient Details  Name: Angelica [name] MRN: [id number] Date of Birth: 04/18/1966 No Data Recorded  Encounter Date: 10/04/2015      PT End of Session - 10/04/15 1251    Visit Number 1   Number of Visits 17   Date for PT Re-Evaluation 12/03/15   Authorization Type BCBS    PT Start Time 0211   PT Stop Time 1233   PT Time Calculation (min) 48 min   Activity Tolerance Patient tolerated treatment well   Behavior During Therapy [facility] for tasks assessed/performed      Past Medical History  Diagnosis Date  . Bipolar disorder ([place])   . Asthma   . PMDD (premenstrual dysphoric disorder)   . Alcoholism ([place])   . CIN I (cervical intraepithelial neoplasia I)   . PTSD (post-traumatic stress disorder)   . PCOS (polycystic ovarian syndrome)   . Hypertension   . Migraine   . Anxiety   . ADHD (attention deficit hyperactivity disorder)   . COPD (chronic obstructive pulmonary disease) ([place])   . Barrett's esophagus   . Vertigo   . Back pain     Past Surgical History  Procedure Laterality Date  . Cervical biopsy  w/ loop electrode excision  1995    CIN l  . Colposcopy  1995    CIN l  . Mouth surgery    . Tonsilectomy, adenoidectomy, bilateral myringotomy and tubes    . Nasal septum surgery    . Back surgery      Spinal fusion - lumbar 2016    There were no vitals filed for this visit.  Visit Diagnosis:  Abnormality of gait  Bilateral low back pain with right-sided sciatica  Generalized weakness      Subjective Assessment - 10/04/15 1149    Subjective "I've had a lot of back troubles, but basically it got really bad and a disc ruptured before May, so they said I needed surgery.  This has been a burtal recovery."  "I have had several injections to assist." "I'm just not walking normal and I  don't know why that is."  "I'm starting to have pain in my heel."    Pertinent History Bipolar, COPD, polysubstance abuse   Limitations Walking;House hold activities;Sitting;Standing   Patient Stated Goals "To walk normal again."   Currently in Pain? Yes   Pain Score 4    Pain Location Back   Pain Orientation Right;Lower   Pain Descriptors / Indicators Burning   Pain Type Chronic pain   Pain Radiating Towards Down to R leg and foot   Pain Onset More than a month ago   Pain Frequency Constant   Aggravating Factors  standing, walking for long periods of time   Pain Relieving Factors laying down, elevating feet            [facility] PT Assessment - 10/04/15 0001    Assessment   Medical Diagnosis Gait abnormality and back pain   Onset Date/Surgical Date 04/22/15   Precautions   Precautions Fall;Back   Restrictions   Weight Bearing Restrictions No   Balance Screen   Has the patient fallen in the past 6 months No   Has the patient had a decrease in activity level because of a fear of falling?  Yes   Is the patient reluctant to leave their home because  of a fear of falling?  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, would like to get back to dancing   Cognition   Overall Cognitive Status Within Functional Limits for tasks assessed   Sensation   Light Touch Impaired Detail   Light Touch Impaired Details Impaired  RLE;Impaired LLE   Additional Comments absent sensation in LLE L2, decreased in L5 and S1, RLE decreased sensation at L4   Coordination   Gross Motor Movements are Fluid and Coordinated Yes  limited somewhat due to strength    Fine Motor Movements are Fluid and Coordinated Yes   ROM / Strength   AROM / PROM / Strength Strength   Strength   Overall Strength Deficits   Overall Strength Comments B hip flex 2+/5, B knee ext 3/5, knee flex 2+/5 on L, 3+/5 on RLE, B ankle DF 3+/5, PF 3/5   Transfers   Transfers Sit to Stand;Stand to Sit   Sit to Stand 6: Modified independent (Device/Increase time)   Stand to Sit 6: Modified independent (Device/Increase time)   Ambulation/Gait   Ambulation/Gait Yes   Ambulation/Gait Assistance 6: Modified independent (Device/Increase time)   Ambulation Distance (Feet) 115 Feet   Assistive device Straight cane   Gait Pattern Step-through pattern;Decreased arm swing - right;Decreased stride length;Decreased stance time - right;Decreased weight shift to right   Ambulation Surface Level;Indoor   Gait velocity 1.27 ft/sec   Stairs Yes   Stairs Assistance 6: Modified independent (Device/Increase time)   Stair Management Technique Two rails;Alternating pattern;Step to pattern;Forwards   Number of Stairs 4   Height of Stairs 6                           PT Education - 10/04/15 1250    Education provided Yes   Education Details Education on evaluation findings, POC, and HEP   Person(s) Educated Patient   Methods Explanation;Handout   Comprehension Verbalized understanding;Returned demonstration;Verbal cues required          PT Short Term Goals - 10/04/15 1256    PT SHORT TERM GOAL #1   Title Will assess Modified oswestry and increase score by 6.4 points to indicate improvement in symptoms and severity of low back pain impacting functional activities.  (Target Date: 11/01/15)   Time 4   Period Weeks   PT SHORT TERM GOAL #2   Title Pt  will initiate HEP for core and LE strengthening to decrease back pain and increase functional mobility.   (Target Date: 11/01/15)   Time 4   Period Weeks   PT SHORT TERM GOAL #3   Title Pt will increase B hip abd strength to 3+/5 to indicate improved functional mobility with less compensations in gait pattern.  (Target Date: 11/01/15)   Time 4   Period Weeks   PT SHORT TERM GOAL #4   Title Pt will report no more  than 2 point increase in pain during therapy session to indicate decreased limitations in mobility due to pain.  (Target Date: 11/01/15)   Time 4   Period Weeks   PT SHORT TERM GOAL #5   Title Pt will increase gait speed to 1.87 ft/sec in order to indicate improved efficiency of gait and decreased fall risk.  (Target Date: 11/01/15)   Time 4   Period Weeks           PT Long Term Goals - 10/04/15 1303    PT LONG TERM GOAL #1   Title Pt will be independent in HEP for core and BLE strengthening to indicate decreased pain and improved functional mobility.  (Target Date: 11/29/15)   Time 8   Period Weeks   PT LONG TERM GOAL #2   Title Pt will increase Modified Oswestry by 6.4 points to indicate improvement in symptoms and severity of low back pain impacting functional activities  (Target Date: 11/29/15)   Time 8   Period Weeks   PT LONG TERM GOAL #3   Title Pt will verbalize return to community fitness program in order to continue to decrease pain and increase functional independence.  (Target Date: 11/29/15)   Time 8   Period Weeks   PT LONG TERM GOAL #4   Title Pt will tolerate ambulation x 10 mins with no more than 2 point increase in pain w/ LRAD at mod I level to indicate improved functional endurance.   (Target Date: 11/29/15)   Time 8   Period Weeks   PT LONG TERM GOAL #5   Title Pt wil increase gait speed to 2.47 ft/sec to indicate improved efficiency of gait and decreased fall risk.  (Target Date: 11/29/15)   Time 8   Period Weeks               Plan -  10/04/15 1251    Clinical Impression Statement Pt presents with increasing back pain, gait abnormality and BLE weakness following back surgery in May of 2016.  Note that BLE weakness (L>R) however more pain in RLE upon PT evaluation.  PT evaluation findings include abnormal gait pattern with multiple compensations to avoid WB on RLE due to increased pain and gait speed of 1.27 ft/sec, indicative of recurrent fall risk.  Based on findings, feel that pt would greatly benefit from skilled OP neuro PT to address deficits.    Pt will benefit from skilled therapeutic intervention in order to improve on the following deficits Abnormal gait;Decreased activity tolerance;Decreased balance;Decreased endurance;Decreased mobility;Decreased strength;Difficulty walking;Impaired perceived functional ability;Impaired flexibility;Impaired sensation;Postural dysfunction;Pain;Improper body mechanics   Rehab Potential Good   Clinical Impairments Affecting Rehab Potential co-morbidities    PT Frequency 2x / week   PT Duration 8 weeks   PT Treatment/Interventions ADLs/Self Care Home Management;Electrical Stimulation;Moist Heat;Ultrasound;Gait training;Stair training;Functional mobility training;Therapeutic activities;Therapeutic exercise;Neuromuscular re-education;Balance training;Patient/family education;Manual techniques   PT Next Visit Plan Assess current HEP for compliance and decrease in pain (add core strengthening as able), core and abdominal strength, hip strength   PT Home Exercise Plan see pt instruction   Consulted and Agree with Plan of Care Patient         Problem List Patient Active Problem List   Diagnosis Date Noted  . Nexplanon in place 05/14/2014  . Ovarian cyst, left 04/01/2014  . Smoker 03/11/2014  . Female pelvic pain 06/21/2012  . Dysmenorrhea 06/21/2012  . Menorrhagia 06/21/2012  . Bipolar disorder ([place])   . Asthma   .  PMDD (premenstrual dysphoric disorder)   . Alcoholism ([place])   . CIN  I (cervical intraepithelial neoplasia I)     Cameron Sprang, PT, MPT [facility] [address] [place], [place], [address] Phone: [phone]   Fax:  [phone] 10/04/2015, 1:12 PM  Name: Angelica [name] MRN: [id number] Date of Birth: [birth year]

## 2015-11-18 ENCOUNTER — Ambulatory Visit: Payer: BLUE CROSS/BLUE SHIELD | Admitting: Women's Health

## 2015-12-16 ENCOUNTER — Encounter: Payer: BLUE CROSS/BLUE SHIELD | Admitting: Gynecology

## 2016-01-05 ENCOUNTER — Encounter: Payer: Self-pay | Admitting: Rehabilitation

## 2016-01-05 NOTE — Therapy (Signed)
[facility] [address] [place], [place], [address] Phone: [phone]   Fax:  [phone]  Patient Details  Name: Angelica [name] MRN: [id number] Date of Birth: 19-Sep-1966 Referring Provider:  No ref. provider found  Encounter Date: 01/05/2016   PHYSICAL THERAPY DISCHARGE SUMMARY  Visits from Start of Care: 1  Current functional level related to goals / functional outcomes:     PT Long Term Goals - 10/04/15 1303    PT LONG TERM GOAL #1   Title Pt will be independent in HEP for core and BLE strengthening to indicate decreased pain and improved functional mobility.  (Target Date: 11/29/15)   Time 8   Period Weeks   PT LONG TERM GOAL #2   Title Pt will increase Modified Oswestry by 6.4 points to indicate improvement in symptoms and severity of low back pain impacting functional activities  (Target Date: 11/29/15)   Time 8   Period Weeks   PT LONG TERM GOAL #3   Title Pt will verbalize return to community fitness program in order to continue to decrease pain and increase functional independence.  (Target Date: 11/29/15)   Time 8   Period Weeks   PT LONG TERM GOAL #4   Title Pt will tolerate ambulation x 10 mins with no more than 2 point increase in pain w/ LRAD at mod I level to indicate improved functional endurance.   (Target Date: 11/29/15)   Time 8   Period Weeks   PT LONG TERM GOAL #5   Title Pt wil increase gait speed to 2.47 ft/sec to indicate improved efficiency of gait and decreased fall risk.  (Target Date: 11/29/15)   Time 8   Period Weeks        Remaining deficits: Unsure as pt did not return for follow up visit.    Education / Equipment: Education on recommendations for PT.   Plan: Patient agrees to discharge.  Patient goals were not met. Patient is being discharged due to not returning since the last visit.  ?????        Cameron Sprang, PT, MPT [facility] [address] [place], [place], [address] Phone: [phone]   Fax:  [phone] 01/05/2016, 9:50 AM

## 2016-01-06 ENCOUNTER — Ambulatory Visit (INDEPENDENT_AMBULATORY_CARE_PROVIDER_SITE_OTHER): Payer: BLUE CROSS/BLUE SHIELD | Admitting: Gynecology

## 2016-01-06 ENCOUNTER — Encounter: Payer: Self-pay | Admitting: Gynecology

## 2016-01-06 ENCOUNTER — Other Ambulatory Visit (HOSPITAL_COMMUNITY)
Admission: RE | Admit: 2016-01-06 | Discharge: 2016-01-06 | Disposition: A | Discharge status: Home / Self Care | Payer: BLUE CROSS/BLUE SHIELD | Source: Ambulatory Visit | Attending: Gynecology | Admitting: Gynecology

## 2016-01-06 VITALS — BP 130/80 | Ht 65.5 in | Wt 215.0 lb

## 2016-01-06 DIAGNOSIS — N921 Excessive and frequent menstruation with irregular cycle: Secondary | ICD-10-CM

## 2016-01-06 DIAGNOSIS — Z01411 Encounter for gynecological examination (general) (routine) with abnormal findings: Secondary | ICD-10-CM | POA: Insufficient documentation

## 2016-01-06 DIAGNOSIS — Z87410 Personal history of cervical dysplasia: Secondary | ICD-10-CM | POA: Diagnosis not present

## 2016-01-06 DIAGNOSIS — D251 Intramural leiomyoma of uterus: Secondary | ICD-10-CM

## 2016-01-06 DIAGNOSIS — Z01419 Encounter for gynecological examination (general) (routine) without abnormal findings: Secondary | ICD-10-CM

## 2016-01-06 DIAGNOSIS — Z720 Tobacco use: Secondary | ICD-10-CM

## 2016-01-06 DIAGNOSIS — E282 Polycystic ovarian syndrome: Secondary | ICD-10-CM

## 2016-01-06 DIAGNOSIS — Z1151 Encounter for screening for human papillomavirus (HPV): Secondary | ICD-10-CM | POA: Insufficient documentation

## 2016-01-06 DIAGNOSIS — F17200 Nicotine dependence, unspecified, uncomplicated: Secondary | ICD-10-CM

## 2016-01-06 LAB — CBC WITH DIFFERENTIAL/PLATELET
BASOS PCT: 0 % (ref 0–1)
Basophils Absolute: 0 10*3/uL (ref 0.0–0.1)
EOS ABS: 0.3 10*3/uL (ref 0.0–0.7)
EOS PCT: 2 % (ref 0–5)
HCT: 42.4 % (ref 36.0–46.0)
HEMOGLOBIN: 14 g/dL (ref 12.0–15.0)
Lymphocytes Relative: 20 % (ref 12–46)
Lymphs Abs: 3 10*3/uL (ref 0.7–4.0)
MCH: 31.3 pg (ref 26.0–34.0)
MCHC: 33 g/dL (ref 30.0–36.0)
MCV: 94.6 fL (ref 78.0–100.0)
MONO ABS: 1.2 10*3/uL — AB (ref 0.1–1.0)
MONOS PCT: 8 % (ref 3–12)
MPV: 8.8 fL (ref 8.6–12.4)
Neutro Abs: 10.6 10*3/uL — ABNORMAL HIGH (ref 1.7–7.7)
Neutrophils Relative %: 70 % (ref 43–77)
PLATELETS: 459 10*3/uL — AB (ref 150–400)
RBC: 4.48 MIL/uL (ref 3.87–5.11)
RDW: 13 % (ref 11.5–15.5)
WBC: 15.1 10*3/uL — AB (ref 4.0–10.5)

## 2016-01-06 NOTE — Patient Instructions (Addendum)
Smoking Cessation, Tips for Success If you are ready to quit smoking, congratulations! You have chosen to help yourself be healthier. Cigarettes bring nicotine, tar, carbon monoxide, and other irritants into your body. Your lungs, heart, and blood vessels will be able to work better without these poisons. There are many different ways to quit smoking. Nicotine gum, nicotine patches, a nicotine inhaler, or nicotine nasal spray can help with physical craving. Hypnosis, support groups, and medicines help break the habit of smoking. WHAT THINGS CAN I DO TO MAKE QUITTING EASIER?  Here are some tips to help you quit for good:  Pick a date when you will quit smoking completely. Tell all of your friends and family about your plan to quit on that date.  Do not try to slowly cut down on the number of cigarettes you are smoking. Pick a quit date and quit smoking completely starting on that day.  Throw away all cigarettes.   Clean and remove all ashtrays from your home, work, and car.  On a card, write down your reasons for quitting. Carry the card with you and read it when you get the urge to smoke.  Cleanse your body of nicotine. Drink enough water and fluids to keep your urine clear or pale yellow. Do this after quitting to flush the nicotine from your body.  Learn to predict your moods. Do not let a bad situation be your excuse to have a cigarette. Some situations in your life might tempt you into wanting a cigarette.  Never have "just one" cigarette. It leads to wanting another and another. Remind yourself of your decision to quit.  Change habits associated with smoking. If you smoked while driving or when feeling stressed, try other activities to replace smoking. Stand up when drinking your coffee. Brush your teeth after eating. Sit in a different chair when you read the paper. Avoid alcohol while trying to quit, and try to drink fewer caffeinated beverages. Alcohol and caffeine may urge you to  smoke.  Avoid foods and drinks that can trigger a desire to smoke, such as sugary or spicy foods and alcohol.  Ask people who smoke not to smoke around you.  Have something planned to do right after eating or having a cup of coffee. For example, plan to take a walk or exercise.  Try a relaxation exercise to calm you down and decrease your stress. Remember, you may be tense and nervous for the first 2 weeks after you quit, but this will pass.  Find new activities to keep your hands busy. Play with a pen, coin, or rubber band. Doodle or draw things on paper.  Brush your teeth right after eating. This will help cut down on the craving for the taste of tobacco after meals. You can also try mouthwash.   Use oral substitutes in place of cigarettes. Try using lemon drops, carrots, cinnamon sticks, or chewing gum. Keep them handy so they are available when you have the urge to smoke.  When you have the urge to smoke, try deep breathing.  Designate your home as a nonsmoking area.  If you are a heavy smoker, ask your health care provider about a prescription for nicotine chewing gum. It can ease your withdrawal from nicotine.  Reward yourself. Set aside the cigarette money you save and buy yourself something nice.  Look for support from others. Join a support group or smoking cessation program. Ask someone at home or at work to help you with your plan   to quit smoking.  Always ask yourself, "Do I need this cigarette or is this just a reflex?" Tell yourself, "Today, I choose not to smoke," or "I do not want to smoke." You are reminding yourself of your decision to quit.  Do not replace cigarette smoking with electronic cigarettes (commonly called e-cigarettes). The safety of e-cigarettes is unknown, and some may contain harmful chemicals.  If you relapse, do not give up! Plan ahead and think about what you will do the next time you get the urge to smoke. HOW WILL I FEEL WHEN I QUIT SMOKING? You  may have symptoms of withdrawal because your body is used to nicotine (the addictive substance in cigarettes). You may crave cigarettes, be irritable, feel very hungry, cough often, get headaches, or have difficulty concentrating. The withdrawal symptoms are only temporary. They are strongest when you first quit but will go away within 10-14 days. When withdrawal symptoms occur, stay in control. Think about your reasons for quitting. Remind yourself that these are signs that your body is healing and getting used to being without cigarettes. Remember that withdrawal symptoms are easier to treat than the major diseases that smoking can cause.  Even after the withdrawal is over, expect periodic urges to smoke. However, these cravings are generally short lived and will go away whether you smoke or not. Do not smoke! WHAT RESOURCES ARE AVAILABLE TO HELP ME QUIT SMOKING? Your health care provider can direct you to community resources or hospitals for support, which may include:  Group support.  Education.  Hypnosis. Therapy.Endometrial Biopsy, Care After Refer to this sheet in the next few weeks. These instructions provide you with information on caring for yourself after your procedure. Your health care provider may also give you more specific instructions. Your treatment has been planned according to current medical practices, but problems sometimes occur. Call your health care provider if you have any problems or questions after your procedure. WHAT TO EXPECT AFTER THE PROCEDURE After your procedure, it is typical to have the following:  You may have mild cramping and a small amount of vaginal bleeding for a few days after the procedure. This is normal. HOME CARE INSTRUCTIONS  Only take over-the-counter or prescription medicine as directed by your health care provider.  Do not douche, use tampons, or have sexual intercourse until your health care provider approves.  Follow your health care  provider's instructions regarding any activity restrictions, such as strenuous exercise or heavy lifting. SEEK MEDICAL CARE IF:  You have heavy bleeding or bleeding longer than 2 days after the procedure.  You have bad smelling drainage from your vagina.  You have a fever and chills.  Youhave severe lower stomach (abdominal) pain. SEEK IMMEDIATE MEDICAL CARE IF:  You have severe cramps in your stomach or back.  You pass large blood clots.  Your bleeding increases.  You become weak or lightheaded, or you pass out.   This information is not intended to replace advice given to you by your health care provider. Make sure you discuss any questions you have with your health care provider.   Document Released: 09/17/2013 Document Reviewed: 09/17/2013 Elsevier Interactive Patient Education [employer].   This information is not intended to replace advice given to you by your health care provider. Make sure you discuss any questions you have with your health care provider.   Document Released: 08/25/2004 Document Revised: 12/18/2014 Document Reviewed: 05/15/2013 Elsevier Interactive Patient Education [employer].

## 2016-01-06 NOTE — Progress Notes (Signed)
Angelica [name] [birth year] [id number]   History:    50 y.o.  for annual gyn exam who stated that in November her cycle was heavy and lasted for 2 weeks. In December and lasted for 8 days and in January lasted for 9 days. She is using barrier contraception. Patient is being followed by her therapist she has history of bipolar disorder and PTSD. Patient was very anxious in the office today. Patient with past history of alcoholism.  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 Psychiatrist manages severe bipolar disease medications and counseling. History of bleeding disorder with binging.Smoker 1-1/2 packs per day. History of breast cancer in mother and PGM, both after menopause. History of asthma. History of spontaneous galactorrhea with normal CT of brain 2012. Sonohysterogram July 2013 anteverted uterus with anterior fibroid 11 mm. Right ovary normal left ovary thin-walled echo-free 26 x 60 mm cyst, no defects seen.  Patient declined flu vaccine. Her last mammogram was in 2010. Patient previously had had in the past Nexplanon but had to be removed because of breakthrough bleeding. Past medical history,surgical history, family history and social history were all reviewed and documented in the EPIC chart.  Gynecologic History Patient's last menstrual period was 12/27/2015. Contraception: condoms Last Pap: 2015. Results were: normal Last mammogram: 2010. Results were: normal  Obstetric History OB History  Gravida Para Term Preterm AB SAB TAB Ectopic Multiple Living  1    1         # Outcome Date GA Lbr Len/2nd Weight Sex Delivery Anes PTL Lv  1 AB                ROS: A ROS was performed and pertinent positives and negatives are included in the history.  GENERAL: No fevers or chills. HEENT: No change in vision, no earache, sore throat or sinus congestion. NECK: No pain or stiffness. CARDIOVASCULAR: No chest pain or pressure. No palpitations. PULMONARY: No  shortness of breath, cough or wheeze. GASTROINTESTINAL: No abdominal pain, nausea, vomiting or diarrhea, melena or bright red blood per rectum. GENITOURINARY: No urinary frequency, urgency, hesitancy or dysuria. MUSCULOSKELETAL: No joint or muscle pain, no back pain, no recent trauma. DERMATOLOGIC: No rash, no itching, no lesions. ENDOCRINE: No polyuria, polydipsia, no heat or cold intolerance. No recent change in weight. HEMATOLOGICAL: No anemia or easy bruising or bleeding. NEUROLOGIC: No headache, seizures, numbness, tingling or weakness. PSYCHIATRIC: No depression, no loss of interest in normal activity or change in sleep pattern.     Exam: chaperone present  BP 130/80 mmHg  Ht 5' 5.5" (1.664 m)  Wt 215 lb (97.523 kg)  BMI 35.22 kg/m2  LMP 12/27/2015  Body mass index is 35.22 kg/(m^2).  General appearance : Well developed well nourished female. No acute distress HEENT: Eyes: no retinal hemorrhage or exudates,  Neck supple, trachea midline, no carotid bruits, no thyroidmegaly Lungs: Clear to auscultation, no rhonchi or wheezes, or rib retractions  Heart: Regular rate and rhythm, no murmurs or gallops Breast:Examined in sitting and supine position were symmetrical in appearance, no palpable masses or tenderness,  no skin retraction, no nipple inversion, no nipple discharge, no skin discoloration, no axillary or supraclavicular lymphadenopathy Abdomen: no palpable masses or tenderness, no rebound or guarding Extremities: no edema or skin discoloration or tenderness  Pelvic:  Bartholin, Urethra, Skene Glands: Within normal limits             Vagina: No gross lesions or discharge  Cervix: No gross lesions or discharge  Uterus  unable to be performed  Adnexa unable to be performed  Anus and perineum  normal   Rectovaginal  normal sphincter tone without palpated masses or tenderness             Hemoccult not indicated   After the Pap smear was obtained the cervix was cleansed with  Betadine solution and a single-tooth tenaculum was placed on the anterior cervical lip after introducing a sterile [place] patient became very distraught and had a major panic attack some tissue was obtained and submitted for histological evaluation the single-tooth tenaculum was removed and bimanual exam was not able to be performed.  Assessment/Plan:  50 y.o. female for annual exam with metromenorrhagia endometrial biopsy done pelvic exam was not able to be performed due to patient's apprehension anxiety and panic attack. s She will return back to the office next week for sonohysterogram. We had discussed about placing a Mirena IUD that with her anxiety no apprehension with may need to do an outpatient setting under mild IV sedation if her sonohysterogram and endometrial biopsy is normal. We will check her CBC today. Requisition to schedule her mammogram was provided also. Patient declined flu vaccine.   Terrance Mass MD, 4:20 PM 01/06/2016

## 2016-01-06 NOTE — Addendum Note (Signed)
Addended by: Thurnell Garbe A on: 01/06/2016 04:30 PM   Modules accepted: Orders, SmartSet

## 2016-01-10 LAB — CYTOLOGY - PAP

## 2016-01-11 ENCOUNTER — Other Ambulatory Visit: Payer: Self-pay | Admitting: Gynecology

## 2016-01-11 DIAGNOSIS — D696 Thrombocytopenia, unspecified: Secondary | ICD-10-CM

## 2016-01-11 DIAGNOSIS — R7989 Other specified abnormal findings of blood chemistry: Secondary | ICD-10-CM

## 2016-01-11 DIAGNOSIS — D72829 Elevated white blood cell count, unspecified: Secondary | ICD-10-CM

## 2016-02-28 ENCOUNTER — Other Ambulatory Visit: Payer: Self-pay | Admitting: Family Medicine

## 2016-02-28 DIAGNOSIS — R109 Unspecified abdominal pain: Secondary | ICD-10-CM

## 2016-03-01 ENCOUNTER — Ambulatory Visit
Admission: RE | Admit: 2016-03-01 | Discharge: 2016-03-01 | Disposition: A | Discharge status: Home / Self Care | Payer: BLUE CROSS/BLUE SHIELD | Source: Ambulatory Visit | Attending: Family Medicine | Admitting: Family Medicine

## 2016-03-01 DIAGNOSIS — R109 Unspecified abdominal pain: Secondary | ICD-10-CM

## 2016-03-06 ENCOUNTER — Other Ambulatory Visit: Payer: Self-pay | Admitting: Family Medicine

## 2016-03-06 DIAGNOSIS — R319 Hematuria, unspecified: Secondary | ICD-10-CM

## 2016-03-06 DIAGNOSIS — R109 Unspecified abdominal pain: Secondary | ICD-10-CM

## 2016-03-07 ENCOUNTER — Ambulatory Visit
Admission: RE | Admit: 2016-03-07 | Discharge: 2016-03-07 | Disposition: A | Discharge status: Home / Self Care | Payer: BLUE CROSS/BLUE SHIELD | Source: Ambulatory Visit | Attending: Family Medicine | Admitting: Family Medicine

## 2016-03-07 DIAGNOSIS — R109 Unspecified abdominal pain: Secondary | ICD-10-CM

## 2016-03-07 DIAGNOSIS — R319 Hematuria, unspecified: Secondary | ICD-10-CM

## 2016-03-13 ENCOUNTER — Encounter: Payer: Self-pay | Admitting: Neurology

## 2016-03-13 ENCOUNTER — Ambulatory Visit (INDEPENDENT_AMBULATORY_CARE_PROVIDER_SITE_OTHER): Payer: BLUE CROSS/BLUE SHIELD | Admitting: Neurology

## 2016-03-13 VITALS — BP 106/68 | HR 76 | Ht 65.5 in | Wt 215.0 lb

## 2016-03-13 DIAGNOSIS — M545 Low back pain, unspecified: Secondary | ICD-10-CM | POA: Insufficient documentation

## 2016-03-13 DIAGNOSIS — G43009 Migraine without aura, not intractable, without status migrainosus: Secondary | ICD-10-CM | POA: Diagnosis not present

## 2016-03-13 DIAGNOSIS — R269 Unspecified abnormalities of gait and mobility: Secondary | ICD-10-CM | POA: Diagnosis not present

## 2016-03-13 DIAGNOSIS — G43909 Migraine, unspecified, not intractable, without status migrainosus: Secondary | ICD-10-CM | POA: Insufficient documentation

## 2016-03-13 MED ORDER — RIZATRIPTAN BENZOATE 10 MG PO TBDP: 10.0000 mg | ORAL_TABLET | ORAL | Status: DC | PRN

## 2016-03-13 NOTE — Progress Notes (Signed)
Chief Complaint  Patient presents with  . Back Pain    She has been going to PT and feels her back pain has improved.  Her gait is better and she is able to walk unassisted.  She still has numnbess in the top of her right foot and her left outer thigh.      PATIENT: Angelica [name] DOB: 16-Dec-1965  Chief Complaint  Patient presents with  . Back Pain    She has been going to PT and feels her back pain has improved.  Her gait is better and she is able to walk unassisted.  She still has numnbess in the top of her right foot and her left outer thigh.     HISTORICAL  Angelica [name] is a 50 years old left-handed female,, seen in refer by her primary care physician Dr. Audley Hose and pain management Dr. Dian Situ for evaluation of bilateral hands tremor  I have reviewed and summarized her most recent office visit, she has past medical history of PTSD, bipolar disorder, on polypharmacy treatment, including Wellbutrin, lithium, Zoloft, Topamax, chronic migraine headaches,  She had lumbar decompression surgery in May 2016 by Dr. Laverta [place] at [facility],  she choose to have surgery at [facility] to be close to her parents, prior to the surgery, she complains of chronic low back pain for more than a decade, radiating pain to right lower extremity, failed to repeat epidural injection,  Surgery did help her low back pain, but she continue have gait difficulty, which is a continuation of her chronic problem, she is now having mild right lateral thigh and deep achy pain, denied left leg symptoms, She denies bowel and bladder incontinence, but she continue have unsteady gait," as if I have forgot what is like to walk normal"  She also complains of few years history of bilateral hands tremor, intermittent, denies family history of tremor, was diagnosed with bipolar disorder since 23, was treated with Lithium at variable time  I have reviewed EMG nerve conduction study  in [facility]nd [facility], bilateral peroneal, and tibial motor responses were normal. Bilateral tibial H reflexes were normal and symmetric, left superficial peroneal sensory response was absent. Right superficial peroneal sensory response was normal. Bilateral sural sensory responses were normal. Left peroneal to EDB motor response showed normal distal latency, C map amplitude, conduction velocity, with absent F-wave latency.  Selected needle examination of bilateral lower extremity muscles and bilateral lumbar sacral paraspinal muscles were normal I have personally reviewed MRI of lumbar in August 2016: 1. Lumbar fusion and right laminectomy at L4-5 without residual or recurrent stenosis. No other significant disc protrusion or focal stenosis.  UPDATE March 13 2016: She continue to have mild bilateral hands posturing tremor, is on higher dose of lithium now for her bipolar disorder, she continue complaining significant low back pain, bilateral lower extremity pain, gait abnormality,   REVIEW OF SYSTEMS: Full 14 system review of systems performed and notable only for: Appetite change, fatigue, eye itching, light sensitivity, eye pain, blurred vision, wheezing, shortness of breath, excessive thirst, abdominal pain, nausea, snoring, incontinence of bladder, frequent urination, joint pain, back pain, achy muscles, muscle cramps, itching, memory loss, headaches, numbness, decreased concentration, depression anxiety  ALLERGIES: Allergies  Allergen Reactions  . Bee Venom Itching, Swelling and Other (See Comments)    Flu like symptoms  . Lamictal [Lamotrigine] Rash    HOME MEDICATIONS: Current Outpatient Prescriptions  Medication Sig Dispense Refill  . aspirin-acetaminophen-caffeine (EXCEDRIN MIGRAINE) 250-250-65 MG per tablet Take 2 tablets by mouth 2 (two) times daily as needed (back pain).    Marland Kitchen atomoxetine (STRATTERA) 80 MG capsule Take 80 mg by mouth  daily.    Marland Kitchen BREO ELLIPTA 200-25 MCG/INH AEPB   4  . buPROPion (WELLBUTRIN XL) 150 MG 24 hr tablet Take 150 mg by mouth daily.  2  . busPIRone (BUSPAR) 15 MG tablet Take 15 mg by mouth 2 (two) times daily.     . fluticasone (FLONASE) 50 MCG/ACT nasal spray Place 2 sprays into both nostrils daily as needed for allergies or rhinitis.     . Fluticasone Furoate-Vilanterol (BREO ELLIPTA) 100-25 MCG/INH AEPB Inhale 1 puff into the lungs daily.    Marland Kitchen levalbuterol (XOPENEX HFA) 45 MCG/ACT inhaler Inhale 2 puffs into the lungs every 4 (four) hours as needed (asthma symptoms).    Marland Kitchen lithium carbonate 300 MG capsule Take 300mg  in am and 600mg  in pm.    . lurasidone (LATUDA) 80 MG TABS tablet Take 160 mg by mouth at bedtime.    . meclizine (ANTIVERT) 25 MG tablet Take 25 mg by mouth 2 (two) times daily as needed for dizziness (motion sickness).     . metFORMIN (GLUCOPHAGE) 500 MG tablet Take 500 mg by mouth 2 (two) times daily with a meal.      . metoprolol succinate (TOPROL-XL) 25 MG 24 hr tablet Take 25 mg by mouth 2 (two) times daily.   3  . montelukast (SINGULAIR) 10 MG tablet Take 10 mg by mouth at bedtime.   10  . sertraline (ZOLOFT) 100 MG tablet Take 150 mg by mouth daily.   10  . SUMAtriptan (IMITREX) 50 MG tablet Take 50 mg by mouth every 2 (two) hours as needed for migraine. May repeat in 2 hours if headache persists or recurs.    Marland Kitchen TIZANIDINE HCL PO Take 1 tablet by mouth at bedtime as needed (back muscle spasms).    . topiramate (TOPAMAX) 50 MG tablet Take 50 mg by mouth at bedtime.   2  . traZODone (DESYREL) 100 MG tablet Take 200 mg by mouth at bedtime.   2   Current Facility-Administered Medications  Medication Dose Route Frequency Provider Last Rate Last Dose  . levonorgestrel (MIRENA) 20 MCG/24HR IUD   Intrauterine Once Terrance Mass, MD        PAST MEDICAL HISTORY: Past Medical History  Diagnosis Date  . Bipolar disorder ([place])   . Asthma   . PMDD (premenstrual dysphoric disorder)    . Alcoholism ([place])   . CIN I (cervical intraepithelial neoplasia I)   . PTSD (post-traumatic stress disorder)   . PCOS (polycystic ovarian syndrome)   . Hypertension   . Migraine   . Anxiety   . ADHD (attention deficit hyperactivity disorder)   . COPD (chronic obstructive pulmonary disease) ([place])   . Barrett's esophagus   . Vertigo   . Back pain     PAST SURGICAL HISTORY: Past Surgical History  Procedure Laterality Date  . Cervical biopsy  w/ loop electrode excision  1995    CIN l  . Colposcopy  1995    CIN l  . Mouth surgery    . Tonsilectomy, adenoidectomy, bilateral myringotomy and tubes    . Nasal septum surgery    . Back surgery      Spinal fusion - lumbar 2016    FAMILY HISTORY: Family History  Problem Relation  Age of Onset  . Breast cancer Mother   . Hypertension Father   . Cancer Father     prostate  . Diabetes Maternal Grandmother   . Bipolar disorder Maternal Grandmother   . Breast cancer Paternal Grandmother   . Bipolar disorder Paternal Grandmother   . Bipolar disorder Maternal Grandfather   . Bipolar disorder Paternal Grandfather     SOCIAL HISTORY:  Social History   Social History  . Marital Status: Divorced    Spouse Name: N/A  . Number of Children: 0  . Years of Education: 16   Occupational History  . Student    Social History Main Topics  . Smoking status: Current Every Day Smoker -- 1.50 packs/day    Types: Cigarettes  . Smokeless tobacco: Not on file  . Alcohol Use: 0.0 oz/week    0 Standard drinks or equivalent per week     Comment: Rarely - only drinks once or twice per year. Previous problem with alcohol.  . Drug Use: Yes     Comment: Previously problem with overuse of pain meds and clonazepam.  . Sexual Activity: Yes   Other Topics Concern  . Not on file   Social History Narrative   Lives at home alone.   Left-handed.   3-5 cups caffeine per day.     PHYSICAL EXAM   Filed Vitals:   03/13/16 1626  BP: 106/68    Pulse: 76  Height: 5' 5.5" (1.664 m)  Weight: 215 lb (97.523 kg)    Not recorded      Body mass index is 35.22 kg/(m^2).  PHYSICAL EXAMNIATION:  Gen: NAD, conversant, well nourised, obese, well groomed                     Cardiovascular: Regular rate rhythm, no peripheral edema, warm, nontender. Eyes: Conjunctivae clear without exudates or hemorrhage Neck: Supple, no carotid bruise. Pulmonary: Clear to auscultation bilaterally   NEUROLOGICAL EXAM:  MENTAL STATUS: Speech:    Speech is normal; fluent and spontaneous with normal comprehension.  Cognition:     Orientation to time, place and person     Normal recent and remote memory     Normal Attention span and concentration     Normal Language, naming, repeating,spontaneous speech     Fund of knowledge   CRANIAL NERVES: CN II: Visual fields are full to confrontation. Fundoscopic exam is normal with sharp discs and no vascular changes. Pupils are round equal and briskly reactive to light. CN III, IV, VI: extraocular movement are normal. No ptosis. CN V: Facial sensation is intact to pinprick in all 3 divisions bilaterally. Corneal responses are intact.  CN VII: Face is symmetric with normal eye closure and smile. CN VIII: Hearing is normal to rubbing fingers CN IX, X: Palate elevates symmetrically. Phonation is normal. CN XI: Head turning and shoulder shrug are intact CN XII: Tongue is midline with normal movements and no atrophy.  MOTOR: Muscle bulk and tone are normal. Muscle strength is normal. She has mild bilateral hands postural tremor  REFLEXES: Reflexes are 2+ and symmetric at the biceps, triceps, knees, and ankles. Plantar responses are flexor.  SENSORY: Intact to light touch, pinprick, position sense, and vibration sense are intact in fingers and toes.  COORDINATION: Rapid alternating movements and fine finger movements are intact. There is no dysmetria on finger-to-nose and heel-knee-shin.     GAIT/STANCE: She needs push up to get up from seated position, cautious, exaggerated posturing, mildly  unsteady   DIAGNOSTIC DATA (LABS, IMAGING, TESTING) - I reviewed patient records, labs, notes, testing and imaging myself where available.   ASSESSMENT AND PLAN  YARIBEL PIETTE is a 50 y.o. female   Gait difficulty  Multifactorial, variable effort from patient, contributing factor also includes deconditioning, low back pain, right leg pain,  I do not see any evidence of lower extremity weakness, no upper motor neuron signs,  I will refer her to physical therapy encouraged her moderate daily exercise  Bilateral hands tremor  Exaggerated physiological tremor, lithium side effect,    Marcial Pacas, M.D. Ph.D.  [facility] [address] [place], [place] [address] Ph: [phone] Fax: [phone]  [id number] Stephanie Acre, MD, Dr. Dian Situ

## 2016-03-17 DIAGNOSIS — F312 Bipolar disorder, current episode manic severe with psychotic features: Secondary | ICD-10-CM | POA: Diagnosis not present

## 2016-03-17 DIAGNOSIS — F4310 Post-traumatic stress disorder, unspecified: Secondary | ICD-10-CM | POA: Diagnosis not present

## 2016-03-20 ENCOUNTER — Ambulatory Visit: Payer: BLUE CROSS/BLUE SHIELD | Admitting: Neurology

## 2016-03-22 DIAGNOSIS — F4310 Post-traumatic stress disorder, unspecified: Secondary | ICD-10-CM | POA: Diagnosis not present

## 2016-03-22 DIAGNOSIS — F312 Bipolar disorder, current episode manic severe with psychotic features: Secondary | ICD-10-CM | POA: Diagnosis not present

## 2016-03-29 DIAGNOSIS — F312 Bipolar disorder, current episode manic severe with psychotic features: Secondary | ICD-10-CM | POA: Diagnosis not present

## 2016-03-29 DIAGNOSIS — F4310 Post-traumatic stress disorder, unspecified: Secondary | ICD-10-CM | POA: Diagnosis not present

## 2016-04-04 DIAGNOSIS — M47896 Other spondylosis, lumbar region: Secondary | ICD-10-CM | POA: Diagnosis not present

## 2016-04-04 DIAGNOSIS — M545 Low back pain: Secondary | ICD-10-CM | POA: Diagnosis not present

## 2016-04-04 DIAGNOSIS — R262 Difficulty in walking, not elsewhere classified: Secondary | ICD-10-CM | POA: Diagnosis not present

## 2016-04-04 DIAGNOSIS — M6281 Muscle weakness (generalized): Secondary | ICD-10-CM | POA: Diagnosis not present

## 2016-04-05 DIAGNOSIS — F4310 Post-traumatic stress disorder, unspecified: Secondary | ICD-10-CM | POA: Diagnosis not present

## 2016-04-05 DIAGNOSIS — F312 Bipolar disorder, current episode manic severe with psychotic features: Secondary | ICD-10-CM | POA: Diagnosis not present

## 2016-04-11 DIAGNOSIS — M47896 Other spondylosis, lumbar region: Secondary | ICD-10-CM | POA: Diagnosis not present

## 2016-04-11 DIAGNOSIS — M545 Low back pain: Secondary | ICD-10-CM | POA: Diagnosis not present

## 2016-04-11 DIAGNOSIS — M6281 Muscle weakness (generalized): Secondary | ICD-10-CM | POA: Diagnosis not present

## 2016-04-11 DIAGNOSIS — R262 Difficulty in walking, not elsewhere classified: Secondary | ICD-10-CM | POA: Diagnosis not present

## 2016-04-12 DIAGNOSIS — F312 Bipolar disorder, current episode manic severe with psychotic features: Secondary | ICD-10-CM | POA: Diagnosis not present

## 2016-04-12 DIAGNOSIS — F4312 Post-traumatic stress disorder, chronic: Secondary | ICD-10-CM | POA: Diagnosis not present

## 2016-04-18 DIAGNOSIS — M545 Low back pain: Secondary | ICD-10-CM | POA: Diagnosis not present

## 2016-04-18 DIAGNOSIS — R262 Difficulty in walking, not elsewhere classified: Secondary | ICD-10-CM | POA: Diagnosis not present

## 2016-04-18 DIAGNOSIS — M6281 Muscle weakness (generalized): Secondary | ICD-10-CM | POA: Diagnosis not present

## 2016-04-18 DIAGNOSIS — M47896 Other spondylosis, lumbar region: Secondary | ICD-10-CM | POA: Diagnosis not present

## 2016-04-19 DIAGNOSIS — F312 Bipolar disorder, current episode manic severe with psychotic features: Secondary | ICD-10-CM | POA: Diagnosis not present

## 2016-04-19 DIAGNOSIS — F4312 Post-traumatic stress disorder, chronic: Secondary | ICD-10-CM | POA: Diagnosis not present

## 2016-04-20 DIAGNOSIS — K219 Gastro-esophageal reflux disease without esophagitis: Secondary | ICD-10-CM | POA: Diagnosis not present

## 2016-04-20 DIAGNOSIS — K2270 Barrett's esophagus without dysplasia: Secondary | ICD-10-CM | POA: Diagnosis not present

## 2016-04-20 DIAGNOSIS — K921 Melena: Secondary | ICD-10-CM | POA: Diagnosis not present

## 2016-04-25 DIAGNOSIS — R262 Difficulty in walking, not elsewhere classified: Secondary | ICD-10-CM | POA: Diagnosis not present

## 2016-04-25 DIAGNOSIS — M47896 Other spondylosis, lumbar region: Secondary | ICD-10-CM | POA: Diagnosis not present

## 2016-04-25 DIAGNOSIS — M6281 Muscle weakness (generalized): Secondary | ICD-10-CM | POA: Diagnosis not present

## 2016-04-25 DIAGNOSIS — M545 Low back pain: Secondary | ICD-10-CM | POA: Diagnosis not present

## 2016-04-26 DIAGNOSIS — F312 Bipolar disorder, current episode manic severe with psychotic features: Secondary | ICD-10-CM | POA: Diagnosis not present

## 2016-04-26 DIAGNOSIS — F4312 Post-traumatic stress disorder, chronic: Secondary | ICD-10-CM | POA: Diagnosis not present

## 2016-04-27 DIAGNOSIS — M545 Low back pain: Secondary | ICD-10-CM | POA: Diagnosis not present

## 2016-04-27 DIAGNOSIS — M6281 Muscle weakness (generalized): Secondary | ICD-10-CM | POA: Diagnosis not present

## 2016-04-27 DIAGNOSIS — R262 Difficulty in walking, not elsewhere classified: Secondary | ICD-10-CM | POA: Diagnosis not present

## 2016-04-27 DIAGNOSIS — M47896 Other spondylosis, lumbar region: Secondary | ICD-10-CM | POA: Diagnosis not present

## 2016-05-02 DIAGNOSIS — M47896 Other spondylosis, lumbar region: Secondary | ICD-10-CM | POA: Diagnosis not present

## 2016-05-02 DIAGNOSIS — M545 Low back pain: Secondary | ICD-10-CM | POA: Diagnosis not present

## 2016-05-02 DIAGNOSIS — R262 Difficulty in walking, not elsewhere classified: Secondary | ICD-10-CM | POA: Diagnosis not present

## 2016-05-02 DIAGNOSIS — M6281 Muscle weakness (generalized): Secondary | ICD-10-CM | POA: Diagnosis not present

## 2016-05-03 DIAGNOSIS — F4312 Post-traumatic stress disorder, chronic: Secondary | ICD-10-CM | POA: Diagnosis not present

## 2016-05-03 DIAGNOSIS — F312 Bipolar disorder, current episode manic severe with psychotic features: Secondary | ICD-10-CM | POA: Diagnosis not present

## 2016-05-04 DIAGNOSIS — M6281 Muscle weakness (generalized): Secondary | ICD-10-CM | POA: Diagnosis not present

## 2016-05-04 DIAGNOSIS — R262 Difficulty in walking, not elsewhere classified: Secondary | ICD-10-CM | POA: Diagnosis not present

## 2016-05-04 DIAGNOSIS — M545 Low back pain: Secondary | ICD-10-CM | POA: Diagnosis not present

## 2016-05-04 DIAGNOSIS — M47896 Other spondylosis, lumbar region: Secondary | ICD-10-CM | POA: Diagnosis not present

## 2016-05-10 DIAGNOSIS — F4312 Post-traumatic stress disorder, chronic: Secondary | ICD-10-CM | POA: Diagnosis not present

## 2016-05-10 DIAGNOSIS — F312 Bipolar disorder, current episode manic severe with psychotic features: Secondary | ICD-10-CM | POA: Diagnosis not present

## 2016-05-16 DIAGNOSIS — M6281 Muscle weakness (generalized): Secondary | ICD-10-CM | POA: Diagnosis not present

## 2016-05-16 DIAGNOSIS — M545 Low back pain: Secondary | ICD-10-CM | POA: Diagnosis not present

## 2016-05-16 DIAGNOSIS — R262 Difficulty in walking, not elsewhere classified: Secondary | ICD-10-CM | POA: Diagnosis not present

## 2016-05-16 DIAGNOSIS — M47896 Other spondylosis, lumbar region: Secondary | ICD-10-CM | POA: Diagnosis not present

## 2016-05-18 DIAGNOSIS — M47896 Other spondylosis, lumbar region: Secondary | ICD-10-CM | POA: Diagnosis not present

## 2016-05-18 DIAGNOSIS — M6281 Muscle weakness (generalized): Secondary | ICD-10-CM | POA: Diagnosis not present

## 2016-05-18 DIAGNOSIS — M545 Low back pain: Secondary | ICD-10-CM | POA: Diagnosis not present

## 2016-05-18 DIAGNOSIS — R262 Difficulty in walking, not elsewhere classified: Secondary | ICD-10-CM | POA: Diagnosis not present

## 2016-05-18 DIAGNOSIS — Z713 Dietary counseling and surveillance: Secondary | ICD-10-CM | POA: Diagnosis not present

## 2016-05-19 DIAGNOSIS — S90561A Insect bite (nonvenomous), right ankle, initial encounter: Secondary | ICD-10-CM | POA: Diagnosis not present

## 2016-05-23 DIAGNOSIS — M47896 Other spondylosis, lumbar region: Secondary | ICD-10-CM | POA: Diagnosis not present

## 2016-05-23 DIAGNOSIS — M545 Low back pain: Secondary | ICD-10-CM | POA: Diagnosis not present

## 2016-05-23 DIAGNOSIS — R262 Difficulty in walking, not elsewhere classified: Secondary | ICD-10-CM | POA: Diagnosis not present

## 2016-05-23 DIAGNOSIS — M6281 Muscle weakness (generalized): Secondary | ICD-10-CM | POA: Diagnosis not present

## 2016-05-24 DIAGNOSIS — S80861D Insect bite (nonvenomous), right lower leg, subsequent encounter: Secondary | ICD-10-CM | POA: Diagnosis not present

## 2016-05-24 DIAGNOSIS — N926 Irregular menstruation, unspecified: Secondary | ICD-10-CM | POA: Diagnosis not present

## 2016-05-25 DIAGNOSIS — F4310 Post-traumatic stress disorder, unspecified: Secondary | ICD-10-CM | POA: Diagnosis not present

## 2016-05-25 DIAGNOSIS — M6281 Muscle weakness (generalized): Secondary | ICD-10-CM | POA: Diagnosis not present

## 2016-05-25 DIAGNOSIS — M47896 Other spondylosis, lumbar region: Secondary | ICD-10-CM | POA: Diagnosis not present

## 2016-05-25 DIAGNOSIS — R262 Difficulty in walking, not elsewhere classified: Secondary | ICD-10-CM | POA: Diagnosis not present

## 2016-05-25 DIAGNOSIS — F312 Bipolar disorder, current episode manic severe with psychotic features: Secondary | ICD-10-CM | POA: Diagnosis not present

## 2016-05-25 DIAGNOSIS — M545 Low back pain: Secondary | ICD-10-CM | POA: Diagnosis not present

## 2016-05-31 DIAGNOSIS — Z713 Dietary counseling and surveillance: Secondary | ICD-10-CM | POA: Diagnosis not present

## 2016-06-01 DIAGNOSIS — F312 Bipolar disorder, current episode manic severe with psychotic features: Secondary | ICD-10-CM | POA: Diagnosis not present

## 2016-06-01 DIAGNOSIS — F4310 Post-traumatic stress disorder, unspecified: Secondary | ICD-10-CM | POA: Diagnosis not present

## 2016-06-07 DIAGNOSIS — F3162 Bipolar disorder, current episode mixed, moderate: Secondary | ICD-10-CM | POA: Diagnosis not present

## 2016-06-07 DIAGNOSIS — F312 Bipolar disorder, current episode manic severe with psychotic features: Secondary | ICD-10-CM | POA: Diagnosis not present

## 2016-06-07 DIAGNOSIS — F4310 Post-traumatic stress disorder, unspecified: Secondary | ICD-10-CM | POA: Diagnosis not present

## 2016-06-08 DIAGNOSIS — M47896 Other spondylosis, lumbar region: Secondary | ICD-10-CM | POA: Diagnosis not present

## 2016-06-08 DIAGNOSIS — R262 Difficulty in walking, not elsewhere classified: Secondary | ICD-10-CM | POA: Diagnosis not present

## 2016-06-08 DIAGNOSIS — M6281 Muscle weakness (generalized): Secondary | ICD-10-CM | POA: Diagnosis not present

## 2016-06-08 DIAGNOSIS — M545 Low back pain: Secondary | ICD-10-CM | POA: Diagnosis not present

## 2016-06-12 DIAGNOSIS — M545 Low back pain: Secondary | ICD-10-CM | POA: Diagnosis not present

## 2016-06-12 DIAGNOSIS — M6281 Muscle weakness (generalized): Secondary | ICD-10-CM | POA: Diagnosis not present

## 2016-06-12 DIAGNOSIS — M47896 Other spondylosis, lumbar region: Secondary | ICD-10-CM | POA: Diagnosis not present

## 2016-06-12 DIAGNOSIS — R262 Difficulty in walking, not elsewhere classified: Secondary | ICD-10-CM | POA: Diagnosis not present

## 2016-06-15 DIAGNOSIS — M6281 Muscle weakness (generalized): Secondary | ICD-10-CM | POA: Diagnosis not present

## 2016-06-15 DIAGNOSIS — M47896 Other spondylosis, lumbar region: Secondary | ICD-10-CM | POA: Diagnosis not present

## 2016-06-15 DIAGNOSIS — M545 Low back pain: Secondary | ICD-10-CM | POA: Diagnosis not present

## 2016-06-15 DIAGNOSIS — R262 Difficulty in walking, not elsewhere classified: Secondary | ICD-10-CM | POA: Diagnosis not present

## 2016-06-20 DIAGNOSIS — M545 Low back pain: Secondary | ICD-10-CM | POA: Diagnosis not present

## 2016-06-20 DIAGNOSIS — M6281 Muscle weakness (generalized): Secondary | ICD-10-CM | POA: Diagnosis not present

## 2016-06-20 DIAGNOSIS — R262 Difficulty in walking, not elsewhere classified: Secondary | ICD-10-CM | POA: Diagnosis not present

## 2016-06-20 DIAGNOSIS — M47896 Other spondylosis, lumbar region: Secondary | ICD-10-CM | POA: Diagnosis not present

## 2016-06-22 DIAGNOSIS — M545 Low back pain: Secondary | ICD-10-CM | POA: Diagnosis not present

## 2016-06-22 DIAGNOSIS — R262 Difficulty in walking, not elsewhere classified: Secondary | ICD-10-CM | POA: Diagnosis not present

## 2016-06-22 DIAGNOSIS — M47896 Other spondylosis, lumbar region: Secondary | ICD-10-CM | POA: Diagnosis not present

## 2016-06-22 DIAGNOSIS — M6281 Muscle weakness (generalized): Secondary | ICD-10-CM | POA: Diagnosis not present

## 2016-06-27 DIAGNOSIS — M47896 Other spondylosis, lumbar region: Secondary | ICD-10-CM | POA: Diagnosis not present

## 2016-06-27 DIAGNOSIS — M6281 Muscle weakness (generalized): Secondary | ICD-10-CM | POA: Diagnosis not present

## 2016-06-27 DIAGNOSIS — R262 Difficulty in walking, not elsewhere classified: Secondary | ICD-10-CM | POA: Diagnosis not present

## 2016-06-27 DIAGNOSIS — M545 Low back pain: Secondary | ICD-10-CM | POA: Diagnosis not present

## 2016-06-29 DIAGNOSIS — M6281 Muscle weakness (generalized): Secondary | ICD-10-CM | POA: Diagnosis not present

## 2016-06-29 DIAGNOSIS — M545 Low back pain: Secondary | ICD-10-CM | POA: Diagnosis not present

## 2016-06-29 DIAGNOSIS — M47896 Other spondylosis, lumbar region: Secondary | ICD-10-CM | POA: Diagnosis not present

## 2016-06-29 DIAGNOSIS — R262 Difficulty in walking, not elsewhere classified: Secondary | ICD-10-CM | POA: Diagnosis not present

## 2016-07-04 DIAGNOSIS — M47896 Other spondylosis, lumbar region: Secondary | ICD-10-CM | POA: Diagnosis not present

## 2016-07-04 DIAGNOSIS — M6281 Muscle weakness (generalized): Secondary | ICD-10-CM | POA: Diagnosis not present

## 2016-07-04 DIAGNOSIS — R262 Difficulty in walking, not elsewhere classified: Secondary | ICD-10-CM | POA: Diagnosis not present

## 2016-07-04 DIAGNOSIS — Z713 Dietary counseling and surveillance: Secondary | ICD-10-CM | POA: Diagnosis not present

## 2016-07-04 DIAGNOSIS — M545 Low back pain: Secondary | ICD-10-CM | POA: Diagnosis not present

## 2016-07-13 DIAGNOSIS — E282 Polycystic ovarian syndrome: Secondary | ICD-10-CM | POA: Diagnosis not present

## 2016-07-13 DIAGNOSIS — R7301 Impaired fasting glucose: Secondary | ICD-10-CM | POA: Diagnosis not present

## 2016-07-13 DIAGNOSIS — K219 Gastro-esophageal reflux disease without esophagitis: Secondary | ICD-10-CM | POA: Diagnosis not present

## 2016-07-13 DIAGNOSIS — Z79899 Other long term (current) drug therapy: Secondary | ICD-10-CM | POA: Diagnosis not present

## 2016-07-13 DIAGNOSIS — J329 Chronic sinusitis, unspecified: Secondary | ICD-10-CM | POA: Diagnosis not present

## 2016-07-20 DIAGNOSIS — R262 Difficulty in walking, not elsewhere classified: Secondary | ICD-10-CM | POA: Diagnosis not present

## 2016-07-20 DIAGNOSIS — M545 Low back pain: Secondary | ICD-10-CM | POA: Diagnosis not present

## 2016-07-20 DIAGNOSIS — M47896 Other spondylosis, lumbar region: Secondary | ICD-10-CM | POA: Diagnosis not present

## 2016-07-20 DIAGNOSIS — M6281 Muscle weakness (generalized): Secondary | ICD-10-CM | POA: Diagnosis not present

## 2016-07-25 DIAGNOSIS — M545 Low back pain: Secondary | ICD-10-CM | POA: Diagnosis not present

## 2016-07-25 DIAGNOSIS — M47896 Other spondylosis, lumbar region: Secondary | ICD-10-CM | POA: Diagnosis not present

## 2016-07-25 DIAGNOSIS — R262 Difficulty in walking, not elsewhere classified: Secondary | ICD-10-CM | POA: Diagnosis not present

## 2016-07-25 DIAGNOSIS — M6281 Muscle weakness (generalized): Secondary | ICD-10-CM | POA: Diagnosis not present

## 2016-07-27 DIAGNOSIS — M545 Low back pain: Secondary | ICD-10-CM | POA: Diagnosis not present

## 2016-07-27 DIAGNOSIS — M47896 Other spondylosis, lumbar region: Secondary | ICD-10-CM | POA: Diagnosis not present

## 2016-07-27 DIAGNOSIS — M6281 Muscle weakness (generalized): Secondary | ICD-10-CM | POA: Diagnosis not present

## 2016-07-27 DIAGNOSIS — R262 Difficulty in walking, not elsewhere classified: Secondary | ICD-10-CM | POA: Diagnosis not present

## 2016-08-03 DIAGNOSIS — M47896 Other spondylosis, lumbar region: Secondary | ICD-10-CM | POA: Diagnosis not present

## 2016-08-03 DIAGNOSIS — M545 Low back pain: Secondary | ICD-10-CM | POA: Diagnosis not present

## 2016-08-03 DIAGNOSIS — Z4789 Encounter for other orthopedic aftercare: Secondary | ICD-10-CM | POA: Diagnosis not present

## 2016-08-04 DIAGNOSIS — R937 Abnormal findings on diagnostic imaging of other parts of musculoskeletal system: Secondary | ICD-10-CM | POA: Diagnosis not present

## 2016-08-04 DIAGNOSIS — M47896 Other spondylosis, lumbar region: Secondary | ICD-10-CM | POA: Diagnosis not present

## 2016-08-04 DIAGNOSIS — M461 Sacroiliitis, not elsewhere classified: Secondary | ICD-10-CM | POA: Diagnosis not present

## 2016-08-09 DIAGNOSIS — Z713 Dietary counseling and surveillance: Secondary | ICD-10-CM | POA: Diagnosis not present

## 2016-08-17 DIAGNOSIS — M47896 Other spondylosis, lumbar region: Secondary | ICD-10-CM | POA: Diagnosis not present

## 2016-08-17 DIAGNOSIS — M545 Low back pain: Secondary | ICD-10-CM | POA: Diagnosis not present

## 2016-08-17 DIAGNOSIS — R262 Difficulty in walking, not elsewhere classified: Secondary | ICD-10-CM | POA: Diagnosis not present

## 2016-08-17 DIAGNOSIS — M6281 Muscle weakness (generalized): Secondary | ICD-10-CM | POA: Diagnosis not present

## 2016-08-29 DIAGNOSIS — Z713 Dietary counseling and surveillance: Secondary | ICD-10-CM | POA: Diagnosis not present

## 2016-09-05 DIAGNOSIS — Z713 Dietary counseling and surveillance: Secondary | ICD-10-CM | POA: Diagnosis not present

## 2016-09-12 DIAGNOSIS — Z713 Dietary counseling and surveillance: Secondary | ICD-10-CM | POA: Diagnosis not present

## 2016-09-20 DIAGNOSIS — Z713 Dietary counseling and surveillance: Secondary | ICD-10-CM | POA: Diagnosis not present

## 2016-10-02 DIAGNOSIS — R109 Unspecified abdominal pain: Secondary | ICD-10-CM | POA: Diagnosis not present

## 2016-10-04 DIAGNOSIS — Z713 Dietary counseling and surveillance: Secondary | ICD-10-CM | POA: Diagnosis not present

## 2016-10-09 DIAGNOSIS — J329 Chronic sinusitis, unspecified: Secondary | ICD-10-CM | POA: Diagnosis not present

## 2016-11-06 DIAGNOSIS — H43393 Other vitreous opacities, bilateral: Secondary | ICD-10-CM | POA: Diagnosis not present

## 2016-11-06 DIAGNOSIS — H04123 Dry eye syndrome of bilateral lacrimal glands: Secondary | ICD-10-CM | POA: Diagnosis not present

## 2016-11-13 DIAGNOSIS — Z79899 Other long term (current) drug therapy: Secondary | ICD-10-CM | POA: Diagnosis not present

## 2016-11-13 DIAGNOSIS — E538 Deficiency of other specified B group vitamins: Secondary | ICD-10-CM | POA: Diagnosis not present

## 2016-11-13 DIAGNOSIS — R5383 Other fatigue: Secondary | ICD-10-CM | POA: Diagnosis not present

## 2016-11-13 DIAGNOSIS — J45901 Unspecified asthma with (acute) exacerbation: Secondary | ICD-10-CM | POA: Diagnosis not present

## 2016-11-15 DIAGNOSIS — Z713 Dietary counseling and surveillance: Secondary | ICD-10-CM | POA: Diagnosis not present

## 2016-11-16 DIAGNOSIS — E538 Deficiency of other specified B group vitamins: Secondary | ICD-10-CM | POA: Diagnosis not present

## 2016-11-30 DIAGNOSIS — E538 Deficiency of other specified B group vitamins: Secondary | ICD-10-CM | POA: Diagnosis not present

## 2016-12-18 DIAGNOSIS — J101 Influenza due to other identified influenza virus with other respiratory manifestations: Secondary | ICD-10-CM | POA: Diagnosis not present

## 2016-12-18 DIAGNOSIS — R6889 Other general symptoms and signs: Secondary | ICD-10-CM | POA: Diagnosis not present

## 2016-12-26 DIAGNOSIS — F3162 Bipolar disorder, current episode mixed, moderate: Secondary | ICD-10-CM | POA: Diagnosis not present

## 2017-01-02 DIAGNOSIS — F3162 Bipolar disorder, current episode mixed, moderate: Secondary | ICD-10-CM | POA: Diagnosis not present

## 2017-01-02 DIAGNOSIS — Z713 Dietary counseling and surveillance: Secondary | ICD-10-CM | POA: Diagnosis not present

## 2017-01-03 DIAGNOSIS — J209 Acute bronchitis, unspecified: Secondary | ICD-10-CM | POA: Diagnosis not present

## 2017-01-09 DIAGNOSIS — F3162 Bipolar disorder, current episode mixed, moderate: Secondary | ICD-10-CM | POA: Diagnosis not present

## 2017-01-30 DIAGNOSIS — F4310 Post-traumatic stress disorder, unspecified: Secondary | ICD-10-CM | POA: Diagnosis not present

## 2017-02-03 DIAGNOSIS — Z713 Dietary counseling and surveillance: Secondary | ICD-10-CM | POA: Diagnosis not present

## 2017-02-03 DIAGNOSIS — F4310 Post-traumatic stress disorder, unspecified: Secondary | ICD-10-CM | POA: Diagnosis not present

## 2017-02-06 DIAGNOSIS — Z713 Dietary counseling and surveillance: Secondary | ICD-10-CM | POA: Diagnosis not present

## 2017-02-13 DIAGNOSIS — F4310 Post-traumatic stress disorder, unspecified: Secondary | ICD-10-CM | POA: Diagnosis not present

## 2017-02-20 DIAGNOSIS — Z713 Dietary counseling and surveillance: Secondary | ICD-10-CM | POA: Diagnosis not present

## 2017-02-20 DIAGNOSIS — F4310 Post-traumatic stress disorder, unspecified: Secondary | ICD-10-CM | POA: Diagnosis not present

## 2017-02-23 DIAGNOSIS — F4310 Post-traumatic stress disorder, unspecified: Secondary | ICD-10-CM | POA: Diagnosis not present

## 2017-02-27 DIAGNOSIS — G43109 Migraine with aura, not intractable, without status migrainosus: Secondary | ICD-10-CM | POA: Diagnosis not present

## 2017-02-27 DIAGNOSIS — M791 Myalgia: Secondary | ICD-10-CM | POA: Diagnosis not present

## 2017-02-27 DIAGNOSIS — F319 Bipolar disorder, unspecified: Secondary | ICD-10-CM | POA: Diagnosis not present

## 2017-02-27 DIAGNOSIS — Z79899 Other long term (current) drug therapy: Secondary | ICD-10-CM | POA: Diagnosis not present

## 2017-03-06 DIAGNOSIS — Z713 Dietary counseling and surveillance: Secondary | ICD-10-CM | POA: Diagnosis not present

## 2017-03-06 DIAGNOSIS — F4310 Post-traumatic stress disorder, unspecified: Secondary | ICD-10-CM | POA: Diagnosis not present

## 2017-03-13 DIAGNOSIS — F4310 Post-traumatic stress disorder, unspecified: Secondary | ICD-10-CM | POA: Diagnosis not present

## 2017-03-19 DIAGNOSIS — E876 Hypokalemia: Secondary | ICD-10-CM | POA: Diagnosis not present

## 2017-03-19 DIAGNOSIS — D34 Benign neoplasm of thyroid gland: Secondary | ICD-10-CM | POA: Diagnosis not present

## 2017-03-19 DIAGNOSIS — E559 Vitamin D deficiency, unspecified: Secondary | ICD-10-CM | POA: Diagnosis not present

## 2017-03-19 DIAGNOSIS — G43109 Migraine with aura, not intractable, without status migrainosus: Secondary | ICD-10-CM | POA: Diagnosis not present

## 2017-03-19 DIAGNOSIS — E538 Deficiency of other specified B group vitamins: Secondary | ICD-10-CM | POA: Diagnosis not present

## 2017-03-19 DIAGNOSIS — I10 Essential (primary) hypertension: Secondary | ICD-10-CM | POA: Diagnosis not present

## 2017-03-19 DIAGNOSIS — Z136 Encounter for screening for cardiovascular disorders: Secondary | ICD-10-CM | POA: Diagnosis not present

## 2017-03-19 DIAGNOSIS — F4310 Post-traumatic stress disorder, unspecified: Secondary | ICD-10-CM | POA: Diagnosis not present

## 2017-03-19 DIAGNOSIS — E7800 Pure hypercholesterolemia, unspecified: Secondary | ICD-10-CM | POA: Diagnosis not present

## 2017-03-19 DIAGNOSIS — F17200 Nicotine dependence, unspecified, uncomplicated: Secondary | ICD-10-CM | POA: Diagnosis not present

## 2017-03-19 DIAGNOSIS — R42 Dizziness and giddiness: Secondary | ICD-10-CM | POA: Diagnosis not present

## 2017-03-20 DIAGNOSIS — E559 Vitamin D deficiency, unspecified: Secondary | ICD-10-CM | POA: Diagnosis not present

## 2017-03-20 DIAGNOSIS — E7800 Pure hypercholesterolemia, unspecified: Secondary | ICD-10-CM | POA: Diagnosis not present

## 2017-03-20 DIAGNOSIS — R42 Dizziness and giddiness: Secondary | ICD-10-CM | POA: Diagnosis not present

## 2017-03-20 DIAGNOSIS — E876 Hypokalemia: Secondary | ICD-10-CM | POA: Diagnosis not present

## 2017-03-20 DIAGNOSIS — E538 Deficiency of other specified B group vitamins: Secondary | ICD-10-CM | POA: Diagnosis not present

## 2017-03-20 DIAGNOSIS — F17200 Nicotine dependence, unspecified, uncomplicated: Secondary | ICD-10-CM | POA: Diagnosis not present

## 2017-03-20 DIAGNOSIS — F4310 Post-traumatic stress disorder, unspecified: Secondary | ICD-10-CM | POA: Diagnosis not present

## 2017-03-20 DIAGNOSIS — D34 Benign neoplasm of thyroid gland: Secondary | ICD-10-CM | POA: Diagnosis not present

## 2017-03-20 DIAGNOSIS — I10 Essential (primary) hypertension: Secondary | ICD-10-CM | POA: Diagnosis not present

## 2017-03-20 DIAGNOSIS — G43109 Migraine with aura, not intractable, without status migrainosus: Secondary | ICD-10-CM | POA: Diagnosis not present

## 2017-03-20 DIAGNOSIS — Z136 Encounter for screening for cardiovascular disorders: Secondary | ICD-10-CM | POA: Diagnosis not present

## 2017-03-21 DIAGNOSIS — E538 Deficiency of other specified B group vitamins: Secondary | ICD-10-CM | POA: Diagnosis not present

## 2017-03-21 DIAGNOSIS — I10 Essential (primary) hypertension: Secondary | ICD-10-CM | POA: Diagnosis not present

## 2017-03-21 DIAGNOSIS — R0609 Other forms of dyspnea: Secondary | ICD-10-CM | POA: Diagnosis not present

## 2017-03-21 DIAGNOSIS — E7800 Pure hypercholesterolemia, unspecified: Secondary | ICD-10-CM | POA: Diagnosis not present

## 2017-03-21 DIAGNOSIS — F17200 Nicotine dependence, unspecified, uncomplicated: Secondary | ICD-10-CM | POA: Diagnosis not present

## 2017-03-21 DIAGNOSIS — J45909 Unspecified asthma, uncomplicated: Secondary | ICD-10-CM | POA: Diagnosis not present

## 2017-03-21 DIAGNOSIS — Z87891 Personal history of nicotine dependence: Secondary | ICD-10-CM | POA: Diagnosis not present

## 2017-03-22 DIAGNOSIS — E538 Deficiency of other specified B group vitamins: Secondary | ICD-10-CM | POA: Diagnosis not present

## 2017-03-22 DIAGNOSIS — D34 Benign neoplasm of thyroid gland: Secondary | ICD-10-CM | POA: Diagnosis not present

## 2017-03-22 DIAGNOSIS — F17200 Nicotine dependence, unspecified, uncomplicated: Secondary | ICD-10-CM | POA: Diagnosis not present

## 2017-03-22 DIAGNOSIS — E559 Vitamin D deficiency, unspecified: Secondary | ICD-10-CM | POA: Diagnosis not present

## 2017-03-22 DIAGNOSIS — E876 Hypokalemia: Secondary | ICD-10-CM | POA: Diagnosis not present

## 2017-03-22 DIAGNOSIS — I10 Essential (primary) hypertension: Secondary | ICD-10-CM | POA: Diagnosis not present

## 2017-03-22 DIAGNOSIS — F4310 Post-traumatic stress disorder, unspecified: Secondary | ICD-10-CM | POA: Diagnosis not present

## 2017-03-22 DIAGNOSIS — E7800 Pure hypercholesterolemia, unspecified: Secondary | ICD-10-CM | POA: Diagnosis not present

## 2017-03-22 DIAGNOSIS — R42 Dizziness and giddiness: Secondary | ICD-10-CM | POA: Diagnosis not present

## 2017-03-22 DIAGNOSIS — G43109 Migraine with aura, not intractable, without status migrainosus: Secondary | ICD-10-CM | POA: Diagnosis not present

## 2017-03-26 DIAGNOSIS — E041 Nontoxic single thyroid nodule: Secondary | ICD-10-CM | POA: Diagnosis not present

## 2017-03-26 DIAGNOSIS — E042 Nontoxic multinodular goiter: Secondary | ICD-10-CM | POA: Diagnosis not present

## 2017-03-26 DIAGNOSIS — E0789 Other specified disorders of thyroid: Secondary | ICD-10-CM | POA: Diagnosis not present

## 2017-03-26 DIAGNOSIS — F17200 Nicotine dependence, unspecified, uncomplicated: Secondary | ICD-10-CM | POA: Diagnosis not present

## 2017-03-26 DIAGNOSIS — I10 Essential (primary) hypertension: Secondary | ICD-10-CM | POA: Diagnosis not present

## 2017-03-28 DIAGNOSIS — E538 Deficiency of other specified B group vitamins: Secondary | ICD-10-CM | POA: Diagnosis not present

## 2017-03-28 DIAGNOSIS — G43109 Migraine with aura, not intractable, without status migrainosus: Secondary | ICD-10-CM | POA: Diagnosis not present

## 2017-03-28 DIAGNOSIS — D34 Benign neoplasm of thyroid gland: Secondary | ICD-10-CM | POA: Diagnosis not present

## 2017-03-28 DIAGNOSIS — E876 Hypokalemia: Secondary | ICD-10-CM | POA: Diagnosis not present

## 2017-03-28 DIAGNOSIS — F4310 Post-traumatic stress disorder, unspecified: Secondary | ICD-10-CM | POA: Diagnosis not present

## 2017-03-28 DIAGNOSIS — R42 Dizziness and giddiness: Secondary | ICD-10-CM | POA: Diagnosis not present

## 2017-03-28 DIAGNOSIS — E7800 Pure hypercholesterolemia, unspecified: Secondary | ICD-10-CM | POA: Diagnosis not present

## 2017-03-28 DIAGNOSIS — I10 Essential (primary) hypertension: Secondary | ICD-10-CM | POA: Diagnosis not present

## 2017-03-28 DIAGNOSIS — F17200 Nicotine dependence, unspecified, uncomplicated: Secondary | ICD-10-CM | POA: Diagnosis not present

## 2017-03-28 DIAGNOSIS — E559 Vitamin D deficiency, unspecified: Secondary | ICD-10-CM | POA: Diagnosis not present

## 2017-03-30 DIAGNOSIS — R942 Abnormal results of pulmonary function studies: Secondary | ICD-10-CM | POA: Diagnosis not present

## 2017-04-03 DIAGNOSIS — F4310 Post-traumatic stress disorder, unspecified: Secondary | ICD-10-CM | POA: Diagnosis not present

## 2017-04-04 DIAGNOSIS — Z713 Dietary counseling and surveillance: Secondary | ICD-10-CM | POA: Diagnosis not present

## 2017-04-10 DIAGNOSIS — E559 Vitamin D deficiency, unspecified: Secondary | ICD-10-CM | POA: Diagnosis not present

## 2017-04-10 DIAGNOSIS — E041 Nontoxic single thyroid nodule: Secondary | ICD-10-CM | POA: Diagnosis not present

## 2017-04-10 DIAGNOSIS — E785 Hyperlipidemia, unspecified: Secondary | ICD-10-CM | POA: Diagnosis not present

## 2017-04-10 DIAGNOSIS — J439 Emphysema, unspecified: Secondary | ICD-10-CM | POA: Diagnosis not present

## 2017-04-12 DIAGNOSIS — F4310 Post-traumatic stress disorder, unspecified: Secondary | ICD-10-CM | POA: Diagnosis not present

## 2017-04-17 DIAGNOSIS — Z713 Dietary counseling and surveillance: Secondary | ICD-10-CM | POA: Diagnosis not present

## 2017-04-17 DIAGNOSIS — F4310 Post-traumatic stress disorder, unspecified: Secondary | ICD-10-CM | POA: Diagnosis not present

## 2017-04-25 ENCOUNTER — Encounter: Payer: Self-pay | Admitting: Gynecology

## 2017-05-01 DIAGNOSIS — F4310 Post-traumatic stress disorder, unspecified: Secondary | ICD-10-CM | POA: Diagnosis not present

## 2017-05-01 DIAGNOSIS — Z713 Dietary counseling and surveillance: Secondary | ICD-10-CM | POA: Diagnosis not present

## 2017-05-04 DIAGNOSIS — I87393 Chronic venous hypertension (idiopathic) with other complications of bilateral lower extremity: Secondary | ICD-10-CM | POA: Diagnosis not present

## 2017-05-04 DIAGNOSIS — M79652 Pain in left thigh: Secondary | ICD-10-CM | POA: Diagnosis not present

## 2017-05-09 DIAGNOSIS — F4310 Post-traumatic stress disorder, unspecified: Secondary | ICD-10-CM | POA: Diagnosis not present

## 2017-05-11 DIAGNOSIS — E559 Vitamin D deficiency, unspecified: Secondary | ICD-10-CM

## 2017-05-11 HISTORY — DX: Vitamin D deficiency, unspecified: E55.9

## 2017-05-15 DIAGNOSIS — Z713 Dietary counseling and surveillance: Secondary | ICD-10-CM | POA: Diagnosis not present

## 2017-05-15 DIAGNOSIS — F4310 Post-traumatic stress disorder, unspecified: Secondary | ICD-10-CM | POA: Diagnosis not present

## 2017-05-16 ENCOUNTER — Ambulatory Visit (INDEPENDENT_AMBULATORY_CARE_PROVIDER_SITE_OTHER): Payer: BLUE CROSS/BLUE SHIELD | Admitting: Gynecology

## 2017-05-16 ENCOUNTER — Telehealth: Payer: Self-pay | Admitting: *Deleted

## 2017-05-16 ENCOUNTER — Encounter: Payer: Self-pay | Admitting: Gynecology

## 2017-05-16 VITALS — BP 108/68 | Ht 66.0 in | Wt 220.8 lb

## 2017-05-16 DIAGNOSIS — N951 Menopausal and female climacteric states: Secondary | ICD-10-CM | POA: Diagnosis not present

## 2017-05-16 DIAGNOSIS — R5383 Other fatigue: Secondary | ICD-10-CM

## 2017-05-16 DIAGNOSIS — N912 Amenorrhea, unspecified: Secondary | ICD-10-CM | POA: Diagnosis not present

## 2017-05-16 DIAGNOSIS — N6320 Unspecified lump in the left breast, unspecified quadrant: Secondary | ICD-10-CM | POA: Diagnosis not present

## 2017-05-16 DIAGNOSIS — N63 Unspecified lump in unspecified breast: Secondary | ICD-10-CM

## 2017-05-16 LAB — CBC WITH DIFFERENTIAL/PLATELET
BASOS PCT: 0 %
Basophils Absolute: 0 cells/uL (ref 0–200)
EOS PCT: 2 %
Eosinophils Absolute: 214 cells/uL (ref 15–500)
HCT: 43.1 % (ref 35.0–45.0)
Hemoglobin: 13.9 g/dL (ref 11.7–15.5)
LYMPHS PCT: 20 %
Lymphs Abs: 2140 cells/uL (ref 850–3900)
MCH: 30.1 pg (ref 27.0–33.0)
MCHC: 32.3 g/dL (ref 32.0–36.0)
MCV: 93.3 fL (ref 80.0–100.0)
MONOS PCT: 9 %
MPV: 8.9 fL (ref 7.5–12.5)
Monocytes Absolute: 963 cells/uL — ABNORMAL HIGH (ref 200–950)
Neutro Abs: 7383 cells/uL (ref 1500–7800)
Neutrophils Relative %: 69 %
PLATELETS: 391 10*3/uL (ref 140–400)
RBC: 4.62 MIL/uL (ref 3.80–5.10)
RDW: 13.3 % (ref 11.0–15.0)
WBC: 10.7 10*3/uL (ref 3.8–10.8)

## 2017-05-16 NOTE — Patient Instructions (Signed)
Perimenopause Perimenopause is the time when your body begins to move into the menopause (no menstrual period for 12 straight months). It is a natural process. Perimenopause can begin 2-8 years before the menopause and usually lasts for 1 year after the menopause. During this time, your ovaries may or may not produce an egg. The ovaries vary in their production of estrogen and progesterone hormones each month. This can cause irregular menstrual periods, difficulty getting pregnant, vaginal bleeding between periods, and uncomfortable symptoms. What are the causes?  Irregular production of the ovarian hormones, estrogen and progesterone, and not ovulating every month. Other causes include:  Tumor of the pituitary gland in the brain.  Medical disease that affects the ovaries.  Radiation treatment.  Chemotherapy.  Unknown causes.  Heavy smoking and excessive alcohol intake can bring on perimenopause sooner. What are the signs or symptoms?  Hot flashes.  Night sweats.  Irregular menstrual periods.  Decreased sex drive.  Vaginal dryness.  Headaches.  Mood swings.  Depression.  Memory problems.  Irritability.  Tiredness.  Weight gain.  Trouble getting pregnant.  The beginning of losing bone cells (osteoporosis).  The beginning of hardening of the arteries (atherosclerosis). How is this diagnosed? Your health care provider will make a diagnosis by analyzing your age, menstrual history, and symptoms. He or she will do a physical exam and note any changes in your body, especially your female organs. Female hormone tests may or may not be helpful depending on the amount of female hormones you produce and when you produce them. However, other hormone tests may be helpful to rule out other problems. How is this treated? In some cases, no treatment is needed. The decision on whether treatment is necessary during the perimenopause should be made by you and your health care  provider based on how the symptoms are affecting you and your lifestyle. Various treatments are available, such as:  Treating individual symptoms with a specific medicine for that symptom.  Herbal medicines that can help specific symptoms.  Counseling.  Group therapy. Follow these instructions at home:  Keep track of your menstrual periods (when they occur, how heavy they are, how long between periods, and how long they last) as well as your symptoms and when they started.  Only take over-the-counter or prescription medicines as directed by your health care provider.  Sleep and rest.  Exercise.  Eat a diet that contains calcium (good for your bones) and soy (acts like the estrogen hormone).  Do not smoke.  Avoid alcoholic beverages.  Take vitamin supplements as recommended by your health care provider. Taking vitamin E may help in certain cases.  Take calcium and vitamin D supplements to help prevent bone loss.  Group therapy is sometimes helpful.  Acupuncture may help in some cases. Contact a health care provider if:  You have questions about any symptoms you are having.  You need a referral to a specialist (gynecologist, psychiatrist, or psychologist). Get help right away if:  You have vaginal bleeding.  Your period lasts longer than 8 days.  Your periods are recurring sooner than 21 days.  You have bleeding after intercourse.  You have severe depression.  You have pain when you urinate.  You have severe headaches.  You have vision problems. This information is not intended to replace advice given to you by your health care provider. Make sure you discuss any questions you have with your health care provider. Document Released: 01/04/2005 Document Revised: 05/04/2016 Document Reviewed: 06/26/2013 Elsevier Interactive   Patient Education  2017 [employer]vier Inc.  

## 2017-05-16 NOTE — Telephone Encounter (Signed)
-----   Message from Terrance Mass, MD sent at 05/16/2017  1:50 PM EDT ----- Please schedule diagnostic mammogram: Last mammogram 2010  Left breast multinodular area 12 'olock  position 4-5 fingerbreadth's from areolar area towards axilla.

## 2017-05-16 NOTE — Telephone Encounter (Signed)
Appointment on 05/18/17 @ 1:40pm pt informed.

## 2017-05-16 NOTE — Progress Notes (Signed)
  Patient is a 51 year old that presented to the office today with 2 complaints one of having palpated of breast mass for several months she has not seek any medical attention and has not had a mammogram since 2010. Patient also states that sometimes she'll go 1-2 months without a menstrual cycle. See previous note dated 01/06/2016 for complete past medical history. Patient also had a Pap smear with ASCUS negative HPV in December 2017 will need a follow-up Pap smear this December time of her annual exam. We'll she states she has quit smoking. Patient has bipolar disorder and PTSD has been followed by her therapist and is on numerous medications.  Exam: Both breasts were examined sitting supine position there were both pendulous the right breast without any palpable masses or tenderness no supraclavicular axillary lymphadenopathy. Left breast multinodular area at the 12:00 position 4-5 fingerbreadths from the areolar region slightly tender. No axillary lymphadenopathy or supraclavicular lymphadenopathy on that side of the breast.  Pelvic exam: Bartholin urethra Skene was within normal limits Vagina: No lesions or discharge Cervix: No gross lesions on inspection Uterus anteverted normal size shape and consistency Adnexa: No Mass or tenderness Rectal exam not done  Patient also been complaining of tiredness and fatigue as well.  Assessment/plan: #1 left breast mass patient will be referred to the radiologist for diagnostic mammogram #2 menstrual irregularity whereby she skipped cycles may be attributed to her medications Strattera which may been contributing to tiredness and fatigue we are going to check a TSH as well as a vitamin D level and CBC. Since she has skipped some cycles and she is perimenopausal we'll check of [place] today. #3 patient with history of ASCUS negative HPV will need Pap smear time of her annual exam December this year.

## 2017-05-17 ENCOUNTER — Other Ambulatory Visit: Payer: Self-pay | Admitting: Gynecology

## 2017-05-17 ENCOUNTER — Encounter: Payer: Self-pay | Admitting: Gynecology

## 2017-05-17 DIAGNOSIS — E559 Vitamin D deficiency, unspecified: Secondary | ICD-10-CM

## 2017-05-17 LAB — VITAMIN D 25 HYDROXY (VIT D DEFICIENCY, FRACTURES): VIT D 25 HYDROXY: 21 ng/mL — AB (ref 30–100)

## 2017-05-17 LAB — FOLLICLE STIMULATING HORMONE: FSH: 55.8 m[IU]/mL

## 2017-05-17 LAB — TSH: TSH: 1.46 m[IU]/L

## 2017-05-17 MED ORDER — VITAMIN D (ERGOCALCIFEROL) 1.25 MG (50000 UNIT) PO CAPS: 50000.0000 [IU] | ORAL_CAPSULE | ORAL | 0 refills | Status: DC

## 2017-05-18 ENCOUNTER — Ambulatory Visit
Admission: RE | Admit: 2017-05-18 | Discharge: 2017-05-18 | Disposition: A | Discharge status: Home / Self Care | Payer: BLUE CROSS/BLUE SHIELD | Source: Ambulatory Visit | Attending: Gynecology | Admitting: Gynecology

## 2017-05-18 DIAGNOSIS — N63 Unspecified lump in unspecified breast: Secondary | ICD-10-CM

## 2017-05-18 DIAGNOSIS — N6489 Other specified disorders of breast: Secondary | ICD-10-CM | POA: Diagnosis not present

## 2017-05-18 DIAGNOSIS — R928 Other abnormal and inconclusive findings on diagnostic imaging of breast: Secondary | ICD-10-CM | POA: Diagnosis not present

## 2017-05-22 DIAGNOSIS — F4310 Post-traumatic stress disorder, unspecified: Secondary | ICD-10-CM | POA: Diagnosis not present

## 2017-05-29 DIAGNOSIS — M545 Low back pain: Secondary | ICD-10-CM | POA: Diagnosis not present

## 2017-05-30 DIAGNOSIS — M545 Low back pain: Secondary | ICD-10-CM | POA: Diagnosis not present

## 2017-05-30 DIAGNOSIS — R937 Abnormal findings on diagnostic imaging of other parts of musculoskeletal system: Secondary | ICD-10-CM | POA: Diagnosis not present

## 2017-06-05 DIAGNOSIS — F4310 Post-traumatic stress disorder, unspecified: Secondary | ICD-10-CM | POA: Diagnosis not present

## 2017-06-06 DIAGNOSIS — Z713 Dietary counseling and surveillance: Secondary | ICD-10-CM | POA: Diagnosis not present

## 2017-06-12 DIAGNOSIS — F4310 Post-traumatic stress disorder, unspecified: Secondary | ICD-10-CM | POA: Diagnosis not present

## 2017-06-19 DIAGNOSIS — R109 Unspecified abdominal pain: Secondary | ICD-10-CM | POA: Diagnosis not present

## 2017-06-20 ENCOUNTER — Other Ambulatory Visit: Payer: Self-pay | Admitting: Family Medicine

## 2017-06-20 DIAGNOSIS — Z713 Dietary counseling and surveillance: Secondary | ICD-10-CM | POA: Diagnosis not present

## 2017-06-20 DIAGNOSIS — F4310 Post-traumatic stress disorder, unspecified: Secondary | ICD-10-CM | POA: Diagnosis not present

## 2017-06-20 DIAGNOSIS — R109 Unspecified abdominal pain: Secondary | ICD-10-CM

## 2017-06-21 ENCOUNTER — Ambulatory Visit
Admission: RE | Admit: 2017-06-21 | Discharge: 2017-06-21 | Disposition: A | Discharge status: Home / Self Care | Payer: BLUE CROSS/BLUE SHIELD | Source: Ambulatory Visit | Attending: Family Medicine | Admitting: Family Medicine

## 2017-06-21 DIAGNOSIS — R109 Unspecified abdominal pain: Secondary | ICD-10-CM | POA: Diagnosis not present

## 2017-06-29 DIAGNOSIS — F4310 Post-traumatic stress disorder, unspecified: Secondary | ICD-10-CM | POA: Diagnosis not present

## 2017-07-03 DIAGNOSIS — M79604 Pain in right leg: Secondary | ICD-10-CM | POA: Diagnosis not present

## 2017-07-03 DIAGNOSIS — F4310 Post-traumatic stress disorder, unspecified: Secondary | ICD-10-CM | POA: Diagnosis not present

## 2017-07-03 DIAGNOSIS — M545 Low back pain: Secondary | ICD-10-CM | POA: Diagnosis not present

## 2017-07-03 DIAGNOSIS — M25551 Pain in right hip: Secondary | ICD-10-CM | POA: Diagnosis not present

## 2017-07-10 DIAGNOSIS — M25551 Pain in right hip: Secondary | ICD-10-CM | POA: Diagnosis not present

## 2017-07-10 DIAGNOSIS — M545 Low back pain: Secondary | ICD-10-CM | POA: Diagnosis not present

## 2017-07-10 DIAGNOSIS — F4310 Post-traumatic stress disorder, unspecified: Secondary | ICD-10-CM | POA: Diagnosis not present

## 2017-07-10 DIAGNOSIS — M79604 Pain in right leg: Secondary | ICD-10-CM | POA: Diagnosis not present

## 2017-07-11 DIAGNOSIS — Z713 Dietary counseling and surveillance: Secondary | ICD-10-CM | POA: Diagnosis not present

## 2017-07-13 DIAGNOSIS — M545 Low back pain: Secondary | ICD-10-CM | POA: Diagnosis not present

## 2017-07-13 DIAGNOSIS — M79604 Pain in right leg: Secondary | ICD-10-CM | POA: Diagnosis not present

## 2017-07-13 DIAGNOSIS — M25551 Pain in right hip: Secondary | ICD-10-CM | POA: Diagnosis not present

## 2017-07-16 DIAGNOSIS — N951 Menopausal and female climacteric states: Secondary | ICD-10-CM | POA: Diagnosis not present

## 2017-07-16 DIAGNOSIS — Z7189 Other specified counseling: Secondary | ICD-10-CM | POA: Diagnosis not present

## 2017-07-16 DIAGNOSIS — Z6837 Body mass index (BMI) 37.0-37.9, adult: Secondary | ICD-10-CM | POA: Diagnosis not present

## 2017-07-17 DIAGNOSIS — Z713 Dietary counseling and surveillance: Secondary | ICD-10-CM | POA: Diagnosis not present

## 2017-07-18 DIAGNOSIS — M25551 Pain in right hip: Secondary | ICD-10-CM | POA: Diagnosis not present

## 2017-07-18 DIAGNOSIS — M79604 Pain in right leg: Secondary | ICD-10-CM | POA: Diagnosis not present

## 2017-07-18 DIAGNOSIS — M545 Low back pain: Secondary | ICD-10-CM | POA: Diagnosis not present

## 2017-07-20 DIAGNOSIS — H04123 Dry eye syndrome of bilateral lacrimal glands: Secondary | ICD-10-CM | POA: Diagnosis not present

## 2017-07-20 DIAGNOSIS — H43393 Other vitreous opacities, bilateral: Secondary | ICD-10-CM | POA: Diagnosis not present

## 2017-07-24 DIAGNOSIS — F4310 Post-traumatic stress disorder, unspecified: Secondary | ICD-10-CM | POA: Diagnosis not present

## 2017-07-27 DIAGNOSIS — F4310 Post-traumatic stress disorder, unspecified: Secondary | ICD-10-CM | POA: Diagnosis not present

## 2017-07-31 DIAGNOSIS — F4310 Post-traumatic stress disorder, unspecified: Secondary | ICD-10-CM | POA: Diagnosis not present

## 2017-08-01 DIAGNOSIS — Z713 Dietary counseling and surveillance: Secondary | ICD-10-CM | POA: Diagnosis not present

## 2017-08-07 DIAGNOSIS — F4310 Post-traumatic stress disorder, unspecified: Secondary | ICD-10-CM | POA: Diagnosis not present

## 2017-09-04 DIAGNOSIS — F4310 Post-traumatic stress disorder, unspecified: Secondary | ICD-10-CM | POA: Diagnosis not present

## 2017-09-10 DIAGNOSIS — Z713 Dietary counseling and surveillance: Secondary | ICD-10-CM | POA: Diagnosis not present

## 2017-09-11 DIAGNOSIS — F4310 Post-traumatic stress disorder, unspecified: Secondary | ICD-10-CM | POA: Diagnosis not present

## 2017-09-17 DIAGNOSIS — E559 Vitamin D deficiency, unspecified: Secondary | ICD-10-CM | POA: Diagnosis not present

## 2017-09-17 DIAGNOSIS — R5383 Other fatigue: Secondary | ICD-10-CM | POA: Diagnosis not present

## 2017-09-17 DIAGNOSIS — Z79899 Other long term (current) drug therapy: Secondary | ICD-10-CM | POA: Diagnosis not present

## 2017-09-17 DIAGNOSIS — R635 Abnormal weight gain: Secondary | ICD-10-CM | POA: Diagnosis not present

## 2017-09-18 DIAGNOSIS — R5383 Other fatigue: Secondary | ICD-10-CM | POA: Diagnosis not present

## 2017-09-18 DIAGNOSIS — Z79899 Other long term (current) drug therapy: Secondary | ICD-10-CM | POA: Diagnosis not present

## 2017-09-20 DIAGNOSIS — F4310 Post-traumatic stress disorder, unspecified: Secondary | ICD-10-CM | POA: Diagnosis not present

## 2017-09-24 DIAGNOSIS — Z713 Dietary counseling and surveillance: Secondary | ICD-10-CM | POA: Diagnosis not present

## 2017-09-25 DIAGNOSIS — F4310 Post-traumatic stress disorder, unspecified: Secondary | ICD-10-CM | POA: Diagnosis not present

## 2017-09-28 DIAGNOSIS — F4310 Post-traumatic stress disorder, unspecified: Secondary | ICD-10-CM | POA: Diagnosis not present

## 2017-10-02 DIAGNOSIS — F322 Major depressive disorder, single episode, severe without psychotic features: Secondary | ICD-10-CM | POA: Diagnosis not present

## 2017-10-04 DIAGNOSIS — Z6838 Body mass index (BMI) 38.0-38.9, adult: Secondary | ICD-10-CM | POA: Diagnosis not present

## 2017-10-04 DIAGNOSIS — E221 Hyperprolactinemia: Secondary | ICD-10-CM | POA: Diagnosis not present

## 2017-10-04 DIAGNOSIS — E282 Polycystic ovarian syndrome: Secondary | ICD-10-CM | POA: Diagnosis not present

## 2017-10-04 DIAGNOSIS — N951 Menopausal and female climacteric states: Secondary | ICD-10-CM | POA: Diagnosis not present

## 2017-10-16 DIAGNOSIS — R1011 Right upper quadrant pain: Secondary | ICD-10-CM | POA: Diagnosis not present

## 2017-10-16 DIAGNOSIS — Z3202 Encounter for pregnancy test, result negative: Secondary | ICD-10-CM | POA: Diagnosis not present

## 2017-10-17 ENCOUNTER — Ambulatory Visit
Admission: RE | Admit: 2017-10-17 | Discharge: 2017-10-17 | Disposition: A | Discharge status: Home / Self Care | Payer: BLUE CROSS/BLUE SHIELD | Source: Ambulatory Visit | Attending: Family Medicine | Admitting: Family Medicine

## 2017-10-17 ENCOUNTER — Other Ambulatory Visit: Payer: Self-pay | Admitting: Family Medicine

## 2017-10-17 DIAGNOSIS — R1011 Right upper quadrant pain: Principal | ICD-10-CM

## 2017-10-17 DIAGNOSIS — R109 Unspecified abdominal pain: Secondary | ICD-10-CM | POA: Diagnosis not present

## 2017-10-17 DIAGNOSIS — Z713 Dietary counseling and surveillance: Secondary | ICD-10-CM | POA: Diagnosis not present

## 2017-10-17 DIAGNOSIS — G8929 Other chronic pain: Secondary | ICD-10-CM

## 2017-10-18 DIAGNOSIS — F3132 Bipolar disorder, current episode depressed, moderate: Secondary | ICD-10-CM | POA: Diagnosis not present

## 2017-10-23 DIAGNOSIS — J209 Acute bronchitis, unspecified: Secondary | ICD-10-CM | POA: Diagnosis not present

## 2017-10-26 DIAGNOSIS — J45901 Unspecified asthma with (acute) exacerbation: Secondary | ICD-10-CM | POA: Diagnosis not present

## 2017-11-07 DIAGNOSIS — Z713 Dietary counseling and surveillance: Secondary | ICD-10-CM | POA: Diagnosis not present

## 2017-11-09 DIAGNOSIS — F4310 Post-traumatic stress disorder, unspecified: Secondary | ICD-10-CM | POA: Diagnosis not present

## 2017-11-15 DIAGNOSIS — F4310 Post-traumatic stress disorder, unspecified: Secondary | ICD-10-CM | POA: Diagnosis not present

## 2017-12-20 DIAGNOSIS — F4010 Social phobia, unspecified: Secondary | ICD-10-CM | POA: Diagnosis not present

## 2017-12-20 DIAGNOSIS — F4312 Post-traumatic stress disorder, chronic: Secondary | ICD-10-CM | POA: Diagnosis not present

## 2017-12-20 DIAGNOSIS — F314 Bipolar disorder, current episode depressed, severe, without psychotic features: Secondary | ICD-10-CM | POA: Diagnosis not present

## 2017-12-25 DIAGNOSIS — F314 Bipolar disorder, current episode depressed, severe, without psychotic features: Secondary | ICD-10-CM | POA: Diagnosis not present

## 2017-12-25 DIAGNOSIS — F4312 Post-traumatic stress disorder, chronic: Secondary | ICD-10-CM | POA: Diagnosis not present

## 2017-12-25 DIAGNOSIS — F4010 Social phobia, unspecified: Secondary | ICD-10-CM | POA: Diagnosis not present

## 2018-01-03 ENCOUNTER — Ambulatory Visit: Payer: BLUE CROSS/BLUE SHIELD | Admitting: Neurology

## 2018-01-03 VITALS — BP 140/80 | HR 84 | Ht 66.0 in | Wt 239.0 lb

## 2018-01-03 DIAGNOSIS — G43009 Migraine without aura, not intractable, without status migrainosus: Secondary | ICD-10-CM

## 2018-01-03 MED ORDER — RIZATRIPTAN BENZOATE 10 MG PO TBDP: 10.0000 mg | ORAL_TABLET | ORAL | 6 refills | Status: DC | PRN

## 2018-01-03 MED ORDER — ONDANSETRON 4 MG PO TBDP: 4.0000 mg | ORAL_TABLET | Freq: Three times a day (TID) | ORAL | 6 refills | Status: DC | PRN

## 2018-01-03 MED ORDER — TOPIRAMATE 100 MG PO TABS: 200.0000 mg | ORAL_TABLET | Freq: Every day | ORAL | 11 refills | Status: DC

## 2018-01-03 NOTE — Progress Notes (Signed)
Chief Complaint  Patient presents with  . Migraine    Last seen 03/13/16.  She is getting at least one migraine per week. They usually last 1-2 days but the worst one lasted 5 days.  She typically experiences nausea, light/noise sensitivity, and dizziness with her migraines.  She is still taking Topamax 50mg  at bedtime.  She has been out of her rizatriptan prescription.  She has been using Excedrin Migraine or Tylenol for pain but tries to use it sparingly (tries to limit it to once weekly).  . Gait Problem    Her previous gait issues resolved with physical therapy.      PATIENT: Angelica [name] DOB: [birth year]  Chief Complaint  Patient presents with  . Migraine    Last seen 03/13/16.  She is getting at least one migraine per week. They usually last 1-2 days but the worst one lasted 5 days.  She typically experiences nausea, light/noise sensitivity, and dizziness with her migraines.  She is still taking Topamax 50mg  at bedtime.  She has been out of her rizatriptan prescription.  She has been using Excedrin Migraine or Tylenol for pain but tries to use it sparingly (tries to limit it to once weekly).  . Gait Problem    Her previous gait issues resolved with physical therapy.     HISTORICAL  Angelica [name] is a 52 years old left-handed female,, seen in refer by her primary care physician Dr. Audley Hose and pain management Dr. Dian Situ for evaluation of bilateral hands tremor  I have reviewed and summarized her most recent office visit, she has past medical history of PTSD, bipolar disorder, on polypharmacy treatment, including Wellbutrin, lithium, Zoloft, Topamax, chronic migraine headaches,  She had lumbar decompression surgery in May 2016 by Dr. Laverta Baltimore at [facility],  she choose to have surgery at [facility] to be close to her parents, prior to the surgery, she complains of chronic low back pain for more than a decade, radiating pain to right lower  extremity, failed to repeat epidural injection,  Surgery did help her low back pain, but she continue have gait difficulty, which is a continuation of her chronic problem, she is now having mild right lateral thigh and deep achy pain, denied left leg symptoms, She denies bowel and bladder incontinence, but she continue have unsteady gait," as if I have forgot what is like to walk normal"  She also complains of few years history of bilateral hands tremor, intermittent, denies family history of tremor, was diagnosed with bipolar disorder since 23, was treated with Lithium at variable time  I have reviewed EMG nerve conduction study in September [facility]nd [facility], bilateral peroneal, and tibial motor responses were normal. Bilateral tibial H reflexes were normal and symmetric, left superficial peroneal sensory response was absent. Right superficial peroneal sensory response was normal. Bilateral sural sensory responses were normal. Left peroneal to EDB motor response showed normal distal latency, C map amplitude, conduction velocity, with absent F-wave latency.  Selected needle examination of bilateral lower extremity muscles and bilateral lumbar sacral paraspinal muscles were normal I have personally reviewed MRI of lumbar in August 2016: 1. Lumbar fusion and right laminectomy at L4-5 without residual or recurrent stenosis. No other significant disc protrusion or focal stenosis.  UPDATE March 13 2016: She continue to have mild bilateral hands posturing tremor, is on higher dose of lithium now for her bipolar disorder, she continue complaining significant low back  pain, bilateral lower extremity pain, gait abnormality,  UPDATE Jan 03 2018: Her gait abnormality has much improved with physical therapy, today she complains of new onset headaches, left side severe pounding headache with associated light noise sensitivity, nauseous, lasting for hours, she has tried  over-the-counter medications, with limited help, she also complains of visual distortion, usually at her peripheral visual field, bright light, sometimes distortion of the peripheral visual field,  She has a history of migraine, but only happened occasionally in the past, she is going through menopause.  She is also on polypharmacy treatment for her bipolar disorder, enjoys painting as her hobby.   REVIEW OF SYSTEMS: Full 14 system review of systems performed and notable only for: As above ALLERGIES: Allergies  Allergen Reactions  . Bee Venom Itching, Swelling and Other (See Comments)    Flu like symptoms  . Lamictal [Lamotrigine] Rash    HOME MEDICATIONS: Current Outpatient Medications  Medication Sig Dispense Refill  . aspirin-acetaminophen-caffeine (EXCEDRIN MIGRAINE) 250-250-65 MG per tablet Take 2 tablets by mouth 2 (two) times daily as needed (back pain).    Marland Kitchen atomoxetine (STRATTERA) 80 MG capsule Take 80 mg by mouth daily.    Marland Kitchen buPROPion (WELLBUTRIN XL) 150 MG 24 hr tablet Take 150 mg by mouth daily.  2  . busPIRone (BUSPAR) 30 MG tablet Take 30 mg by mouth 2 (two) times daily.    . fluticasone (FLONASE) 50 MCG/ACT nasal spray Place 2 sprays into both nostrils daily as needed for allergies or rhinitis.     . Fluticasone Furoate-Vilanterol (BREO ELLIPTA) 100-25 MCG/INH AEPB Inhale 1 puff into the lungs daily.    Marland Kitchen levalbuterol (XOPENEX HFA) 45 MCG/ACT inhaler Inhale 2 puffs into the lungs every 4 (four) hours as needed (asthma symptoms).    Marland Kitchen lithium carbonate 300 MG capsule Take 300mg  in am and 600mg  in pm.    . lurasidone (LATUDA) 80 MG TABS tablet Take 160 mg by mouth at bedtime.    . meclizine (ANTIVERT) 25 MG tablet Take 25 mg by mouth 2 (two) times daily as needed for dizziness (motion sickness).     . metFORMIN (GLUCOPHAGE) 500 MG tablet Take 500 mg by mouth 2 (two) times daily with a meal.    . metoprolol succinate (TOPROL-XL) 25 MG 24 hr tablet Take 25 mg by mouth 2 (two)  times daily.   3  . montelukast (SINGULAIR) 10 MG tablet Take 10 mg by mouth at bedtime.   10  . rizatriptan (MAXALT-MLT) 10 MG disintegrating tablet Take 1 tablet (10 mg total) by mouth as needed. May repeat in 2 hours if needed 12 tablet 6  . sertraline (ZOLOFT) 100 MG tablet Take 200 mg by mouth daily.   10  . topiramate (TOPAMAX) 50 MG tablet Take 50 mg by mouth at bedtime.   2  . traZODone (DESYREL) 100 MG tablet Take 100 mg by mouth at bedtime.   2   No current facility-administered medications for this visit.     PAST MEDICAL HISTORY: Past Medical History:  Diagnosis Date  . ADHD (attention deficit hyperactivity disorder)   . Alcoholism ([place])   . Anxiety   . Asthma   . Back pain   . Barrett's esophagus   . Bipolar disorder ([place])   . CIN I (cervical intraepithelial neoplasia I)   . COPD (chronic obstructive pulmonary disease) ([place])   . COPD (chronic obstructive pulmonary disease) ([place])   . Emphysema of lung ([place])   . Hypertension   .  Migraine   . PCOS (polycystic ovarian syndrome)   . PMDD (premenstrual dysphoric disorder)   . PTSD (post-traumatic stress disorder)   . Vertigo   . Vitamin D deficiency 05/2017    PAST SURGICAL HISTORY: Past Surgical History:  Procedure Laterality Date  . BACK SURGERY     Spinal fusion - lumbar 2016  . CERVICAL BIOPSY  W/ LOOP ELECTRODE EXCISION  1995   CIN l  . COLPOSCOPY  1995   CIN l  . MOUTH SURGERY    . NASAL SEPTUM SURGERY    . TONSILECTOMY, ADENOIDECTOMY, BILATERAL MYRINGOTOMY AND TUBES      FAMILY HISTORY: Family History  Problem Relation Age of Onset  . Breast cancer Mother   . Hypertension Father   . Cancer Father        prostate  . Diabetes Maternal Grandmother   . Bipolar disorder Maternal Grandmother   . Breast cancer Paternal Grandmother   . Bipolar disorder Paternal Grandmother   . Bipolar disorder Maternal Grandfather   . Bipolar disorder Paternal Grandfather     SOCIAL HISTORY:  Social History    Socioeconomic History  . Marital status: Divorced    Spouse name: Not on file  . Number of children: 0  . Years of education: 72  . Highest education level: Not on file  Social Needs  . Financial resource strain: Not on file  . Food insecurity - worry: Not on file  . Food insecurity - inability: Not on file  . Transportation needs - medical: Not on file  . Transportation needs - non-medical: Not on file  Occupational History  . Occupation: Ship broker  Tobacco Use  . Smoking status: Former Smoker    Packs/day: 1.50    Types: Cigarettes  . Smokeless tobacco: Never Used  Substance and Sexual Activity  . Alcohol use: Yes    Alcohol/week: 0.0 oz    Comment: Rarely - only drinks once or twice per year. Previous problem with alcohol.  . Drug use: Yes    Comment: Previously problem with overuse of pain meds and clonazepam.  . Sexual activity: Yes    Birth control/protection: None  Other Topics Concern  . Not on file  Social History Narrative   Lives at home alone.   Left-handed.   3-5 cups caffeine per day.     PHYSICAL EXAM   Vitals:   01/03/18 1545  BP: 140/80  Pulse: 84  Weight: 239 lb (108.4 kg)  Height: 5\' 6"  (1.676 m)    Not recorded      Body mass index is 38.58 kg/m.  PHYSICAL EXAMNIATION:  Gen: NAD, conversant, well nourised, obese, well groomed                     Cardiovascular: Regular rate rhythm, no peripheral edema, warm, nontender. Eyes: Conjunctivae clear without exudates or hemorrhage Neck: Supple, no carotid bruise. Pulmonary: Clear to auscultation bilaterally   NEUROLOGICAL EXAM:  MENTAL STATUS: Speech:    Speech is normal; fluent and spontaneous with normal comprehension.  Cognition:     Orientation to time, place and person     Normal recent and remote memory     Normal Attention span and concentration     Normal Language, naming, repeating,spontaneous speech     Fund of knowledge   CRANIAL NERVES: CN II: Visual fields are full  to confrontation. Fundoscopic exam is normal with sharp discs and no vascular changes. Pupils are round equal and briskly  reactive to light. CN III, IV, VI: extraocular movement are normal. No ptosis. CN V: Facial sensation is intact to pinprick in all 3 divisions bilaterally. Corneal responses are intact.  CN VII: Face is symmetric with normal eye closure and smile. CN VIII: Hearing is normal to rubbing fingers CN IX, X: Palate elevates symmetrically. Phonation is normal. CN XI: Head turning and shoulder shrug are intact CN XII: Tongue is midline with normal movements and no atrophy.  MOTOR: Muscle bulk and tone are normal. Muscle strength is normal. She has mild bilateral hands postural tremor  REFLEXES: Reflexes are 2+ and symmetric at the biceps, triceps, knees, and ankles. Plantar responses are flexor.  SENSORY: Intact to light touch, pinprick, position sense, and vibration sense are intact in fingers and toes.  COORDINATION: Rapid alternating movements and fine finger movements are intact. There is no dysmetria on finger-to-nose and heel-knee-shin.    GAIT/STANCE: She needs push up to get up from seated position, cautious, exaggerated posturing, mildly unsteady   DIAGNOSTIC DATA (LABS, IMAGING, TESTING) - I reviewed patient records, labs, notes, testing and imaging myself where available.   ASSESSMENT AND PLAN  Angelica [name] is a 52 y.o. female   Chronic migraine headaches,  She is already taking Topamax 100 mg every night from her psychiatrist, will increase the dose to 200 mg every night as migraine prevention  Maxalt as needed, along with Zofran, NSAIDs for moderate to severe headaches.    Marcial Pacas, M.D. Ph.D.  [facility] [address] [place], [place] [address] Ph: [phone] Fax: [phone]  [id number] Stephanie Acre, MD, Dr. Dian Situ

## 2018-01-04 ENCOUNTER — Encounter: Payer: Self-pay | Admitting: Neurology

## 2018-01-08 DIAGNOSIS — F4010 Social phobia, unspecified: Secondary | ICD-10-CM | POA: Diagnosis not present

## 2018-01-08 DIAGNOSIS — F314 Bipolar disorder, current episode depressed, severe, without psychotic features: Secondary | ICD-10-CM | POA: Diagnosis not present

## 2018-01-08 DIAGNOSIS — F4312 Post-traumatic stress disorder, chronic: Secondary | ICD-10-CM | POA: Diagnosis not present

## 2018-01-15 DIAGNOSIS — F4010 Social phobia, unspecified: Secondary | ICD-10-CM | POA: Diagnosis not present

## 2018-01-15 DIAGNOSIS — F4312 Post-traumatic stress disorder, chronic: Secondary | ICD-10-CM | POA: Diagnosis not present

## 2018-01-15 DIAGNOSIS — F314 Bipolar disorder, current episode depressed, severe, without psychotic features: Secondary | ICD-10-CM | POA: Diagnosis not present

## 2018-01-23 DIAGNOSIS — F4010 Social phobia, unspecified: Secondary | ICD-10-CM | POA: Diagnosis not present

## 2018-01-23 DIAGNOSIS — F4312 Post-traumatic stress disorder, chronic: Secondary | ICD-10-CM | POA: Diagnosis not present

## 2018-01-23 DIAGNOSIS — F314 Bipolar disorder, current episode depressed, severe, without psychotic features: Secondary | ICD-10-CM | POA: Diagnosis not present

## 2018-01-29 DIAGNOSIS — F4312 Post-traumatic stress disorder, chronic: Secondary | ICD-10-CM | POA: Diagnosis not present

## 2018-01-29 DIAGNOSIS — F4010 Social phobia, unspecified: Secondary | ICD-10-CM | POA: Diagnosis not present

## 2018-01-29 DIAGNOSIS — F314 Bipolar disorder, current episode depressed, severe, without psychotic features: Secondary | ICD-10-CM | POA: Diagnosis not present

## 2018-01-30 DIAGNOSIS — Z713 Dietary counseling and surveillance: Secondary | ICD-10-CM | POA: Diagnosis not present

## 2018-01-31 IMAGING — CT CT ABD-PELV W/O CM
2 of 4 series · 14 of 36 positions shown, 19 images · non-contrast
Comparison: 01/14/2015

CLINICAL DATA: RIGHT-side abdominal pain into back, pain for 2
weeks, hematuria, hypertension, COPD, smoker

EXAM:
CT ABDOMEN AND PELVIS WITHOUT CONTRAST
TECHNIQUE: Multidetector CT imaging of the abdomen and pelvis was performed
following the standard protocol without IV contrast. Sagittal and
coronal MPR images reconstructed from axial data set. Oral contrast
not administered for this indication

[Series 601: coronal body · coronal · 0.88mm/px · 1 of 136 slices shown, 2 images]
[im 46/136  soft-tissue]
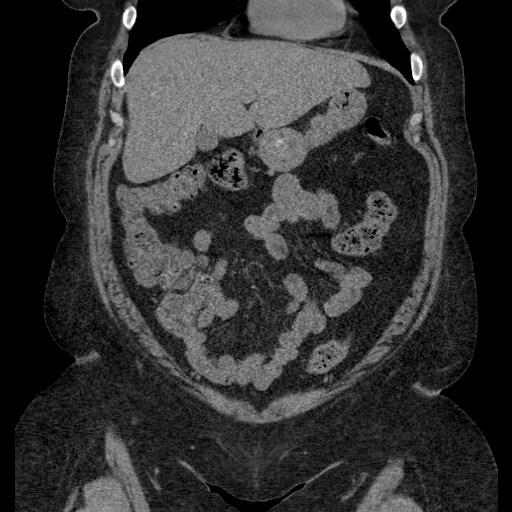
[im 46/136  bone]
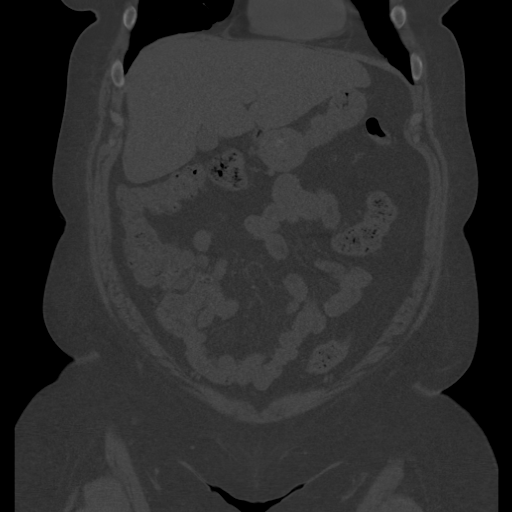

[Series 602: sagittal body · sagittal · 0.88mm/px · 13 of 180 slices shown, 17 images]
[im 10/180  soft-tissue]
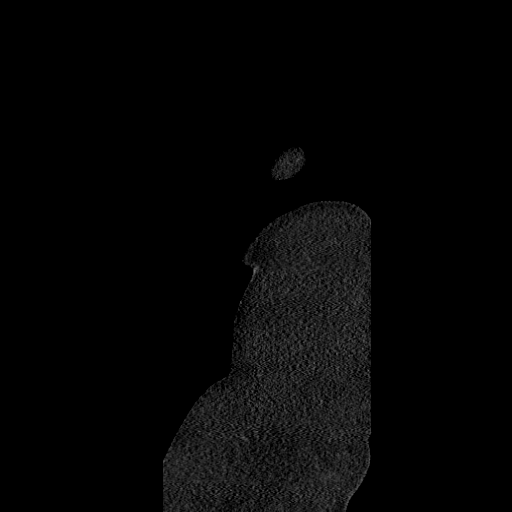
[im 10/180  lung]
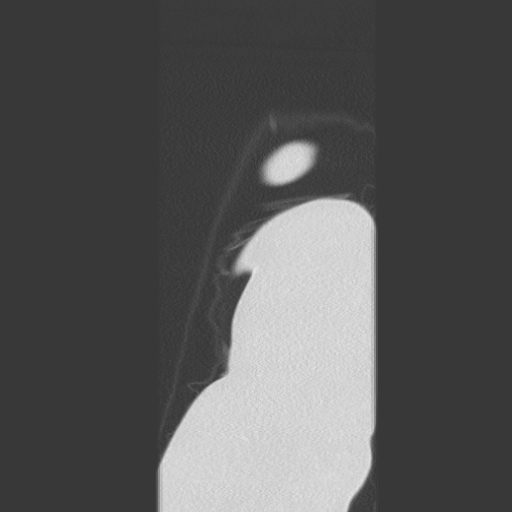
[im 10/180  bone]
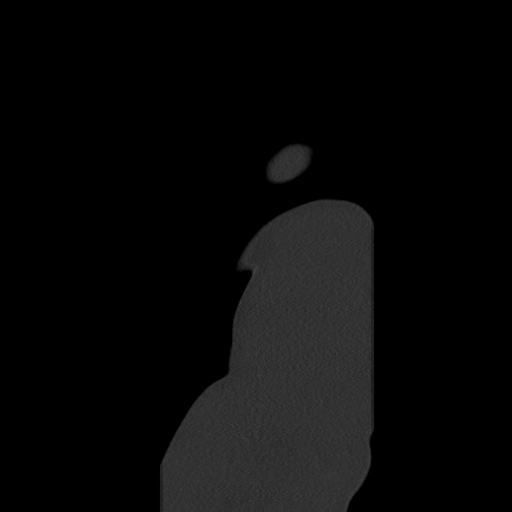
[im 19/180  lung]
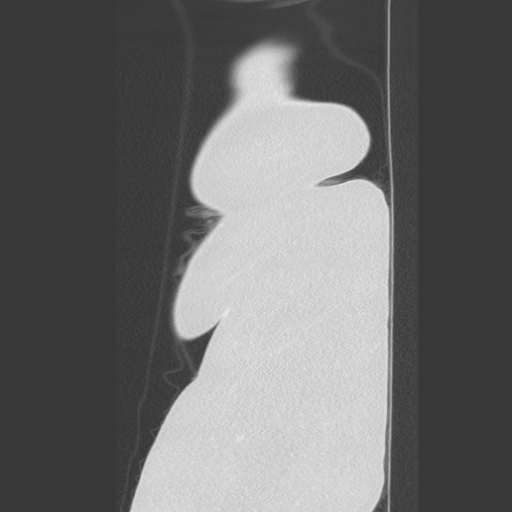
[im 29/180  soft-tissue]
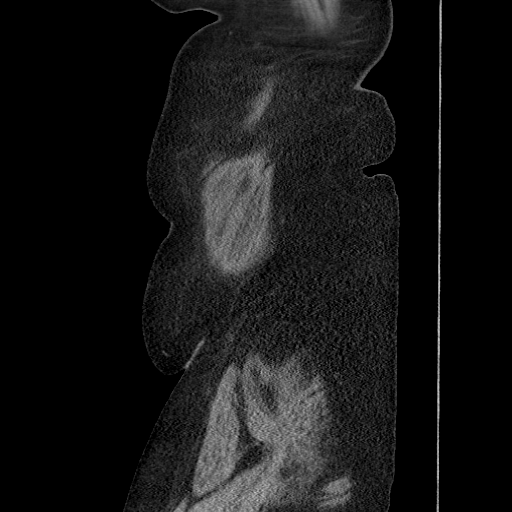
[im 29/180  lung]
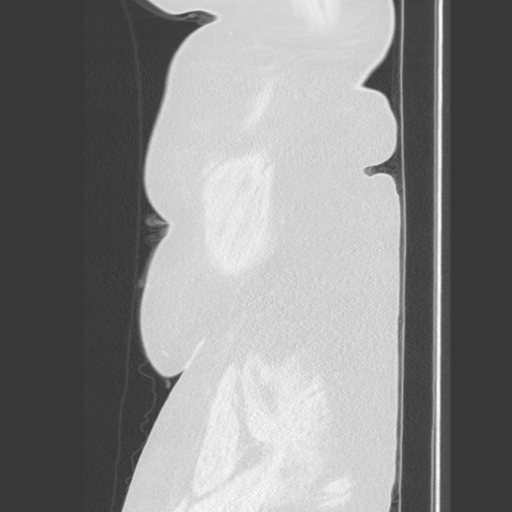
[im 38/180  lung]
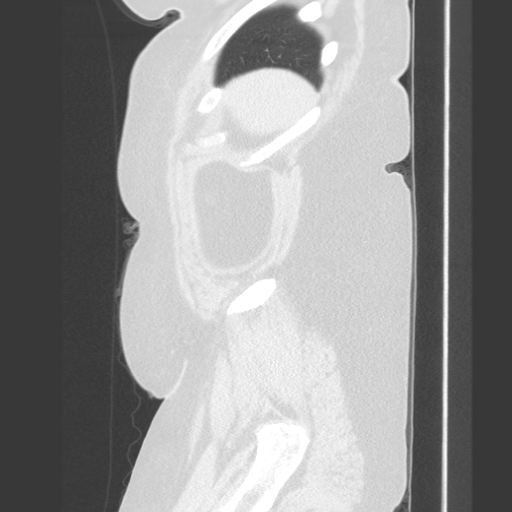
[im 48/180  soft-tissue]
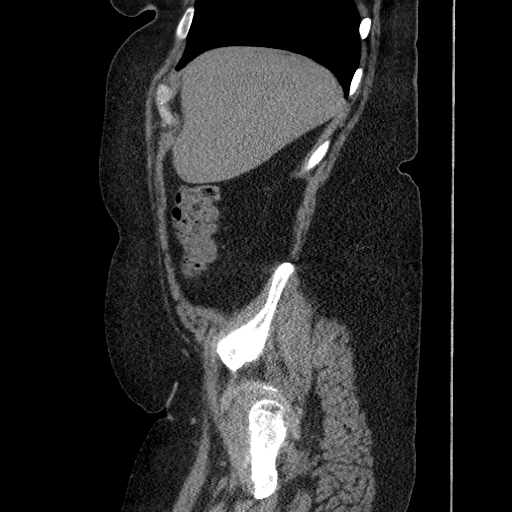
[im 57/180  soft-tissue]
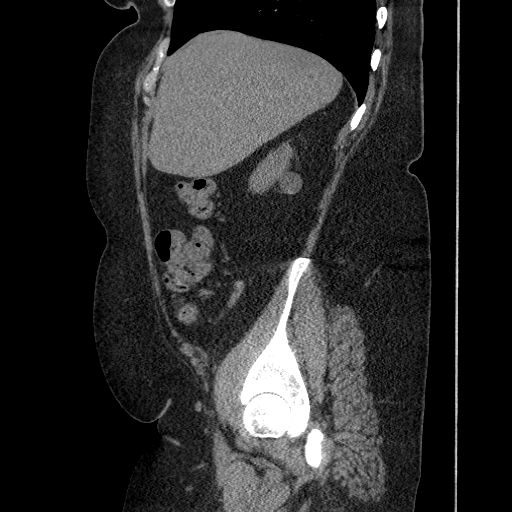
[im 76/180  soft-tissue]
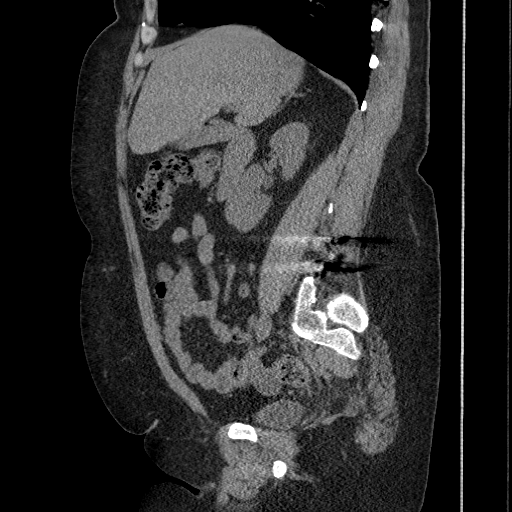
[im 95/180  soft-tissue]
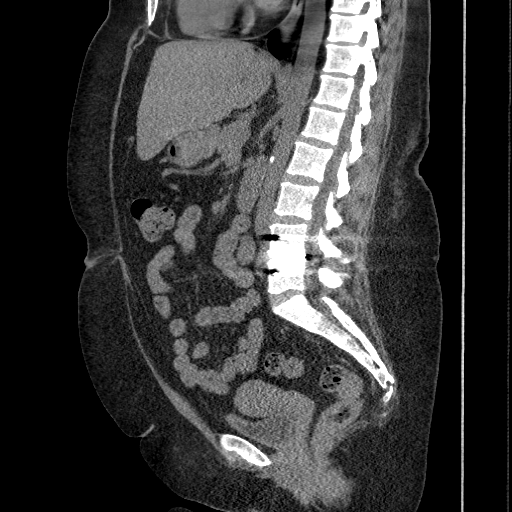
[im 104/180  soft-tissue]
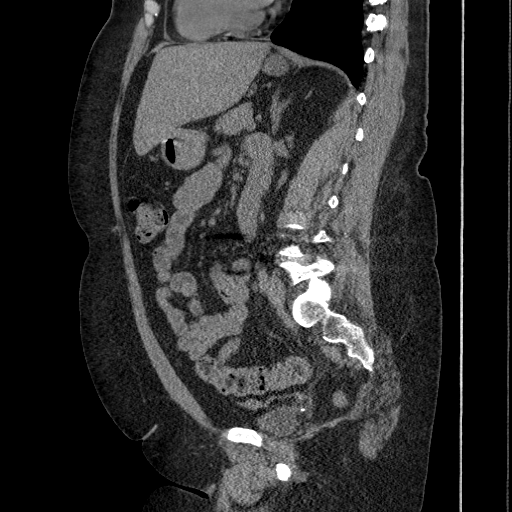
[im 123/180  soft-tissue]
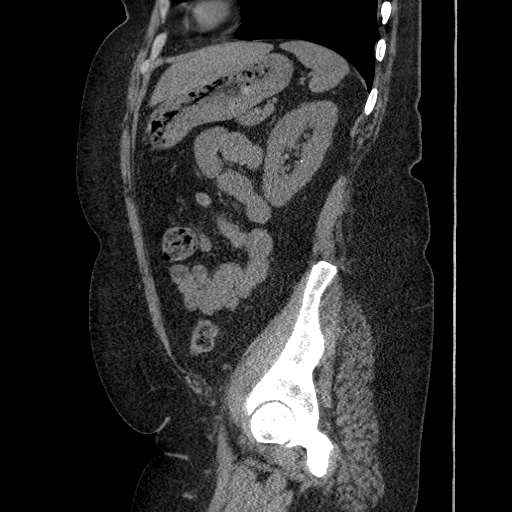
[im 132/180  soft-tissue]
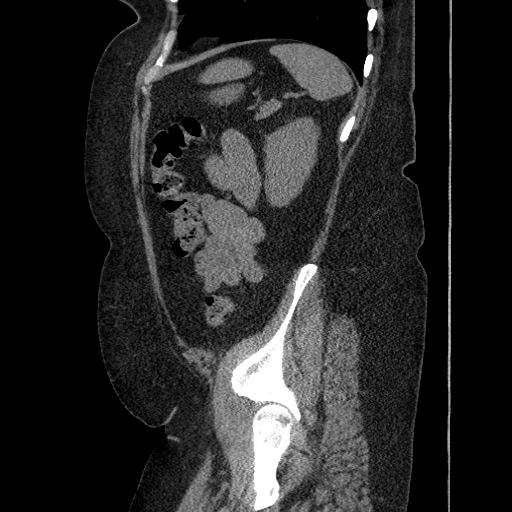
[im 132/180  bone]
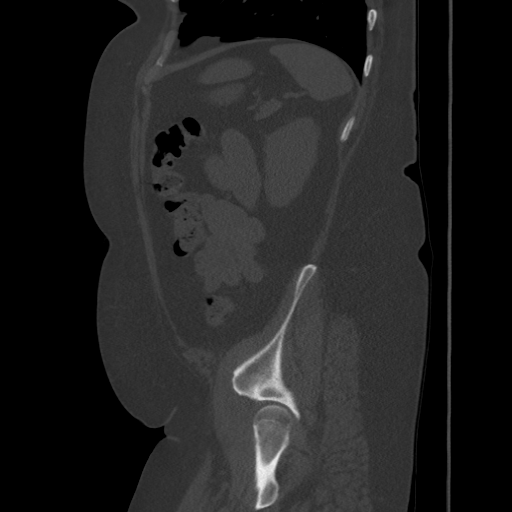
[im 151/180  soft-tissue]
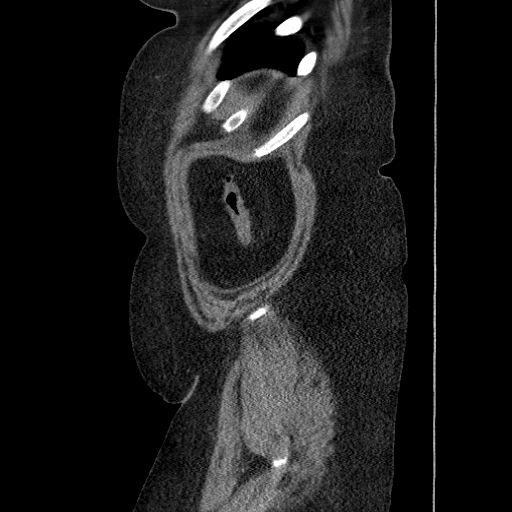
[im 170/180  soft-tissue]
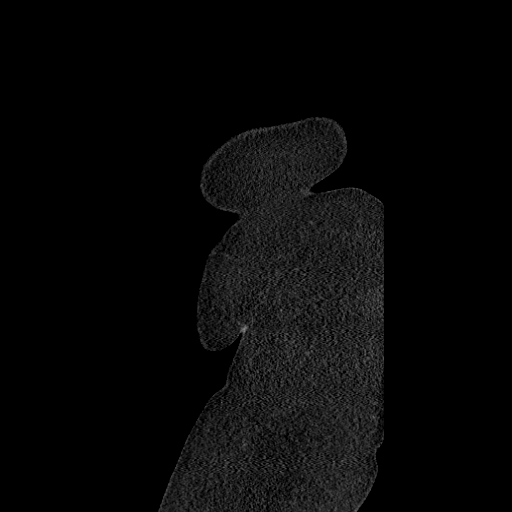

[14 of 36 positions shown; findings below may reference images not displayed]

FINDINGS: Lower chest:  Lung bases clear

Hepatobiliary: Liver and gallbladder unremarkable for noncontrast
technique. No biliary dilatation.

Pancreas: Normal appearance for noncontrast technique

Spleen: Normal size and appearance for technique

Adrenals/Urinary Tract: Small exophytic cyst posterior RIGHT kidney
20 x 17 mm image 33. No urinary tract calcification, hydronephrosis
or ureteral dilatation. No additional renal mass lesions identified
on noncontrast study. Normal caliber ureters. Bladder poorly
distended but otherwise unremarkable.

Stomach/Bowel: Normal appendix. Stomach and bowel loops grossly
unremarkable.

Vascular/Lymphatic: Minimal atherosclerotic calcification aorta
without aneurysm. No adenopathy.

Reproductive: Unremarkable uterus and adnexa.

Other: No mass, free air or free fluid.  No hernia.

Musculoskeletal: Posterior fusion on RIGHT at L4-L5 with disc
prosthesis, pedicle screws and posterior bar.
IMPRESSION: Small exophytic cyst posterior RIGHT kidney 20 x 17 mm increased in
size since 8957.

Otherwise negative noncontrast CT abdomen and pelvis.

## 2018-02-05 DIAGNOSIS — K2270 Barrett's esophagus without dysplasia: Secondary | ICD-10-CM | POA: Diagnosis not present

## 2018-02-05 DIAGNOSIS — R1011 Right upper quadrant pain: Secondary | ICD-10-CM | POA: Diagnosis not present

## 2018-02-05 DIAGNOSIS — F314 Bipolar disorder, current episode depressed, severe, without psychotic features: Secondary | ICD-10-CM | POA: Diagnosis not present

## 2018-02-05 DIAGNOSIS — F4312 Post-traumatic stress disorder, chronic: Secondary | ICD-10-CM | POA: Diagnosis not present

## 2018-02-05 DIAGNOSIS — F4010 Social phobia, unspecified: Secondary | ICD-10-CM | POA: Diagnosis not present

## 2018-02-06 ENCOUNTER — Other Ambulatory Visit (HOSPITAL_COMMUNITY): Payer: Self-pay | Admitting: Family Medicine

## 2018-02-06 DIAGNOSIS — R1011 Right upper quadrant pain: Secondary | ICD-10-CM

## 2018-02-12 DIAGNOSIS — F4312 Post-traumatic stress disorder, chronic: Secondary | ICD-10-CM | POA: Diagnosis not present

## 2018-02-12 DIAGNOSIS — F4010 Social phobia, unspecified: Secondary | ICD-10-CM | POA: Diagnosis not present

## 2018-02-12 DIAGNOSIS — F314 Bipolar disorder, current episode depressed, severe, without psychotic features: Secondary | ICD-10-CM | POA: Diagnosis not present

## 2018-02-13 DIAGNOSIS — K2950 Unspecified chronic gastritis without bleeding: Secondary | ICD-10-CM | POA: Diagnosis not present

## 2018-02-18 ENCOUNTER — Encounter (HOSPITAL_COMMUNITY)
Admission: RE | Admit: 2018-02-18 | Discharge: 2018-02-18 | Disposition: A | Discharge status: Home / Self Care | Payer: BLUE CROSS/BLUE SHIELD | Source: Ambulatory Visit | Attending: Family Medicine | Admitting: Family Medicine

## 2018-02-18 DIAGNOSIS — R1011 Right upper quadrant pain: Secondary | ICD-10-CM | POA: Diagnosis not present

## 2018-02-18 MED ORDER — TECHNETIUM TC 99M MEBROFENIN IV KIT
5.0000 | PACK | Freq: Once | INTRAVENOUS | Status: AC | PRN
  Administered 2018-02-18: 5 via INTRAVENOUS

## 2018-02-25 DIAGNOSIS — K2270 Barrett's esophagus without dysplasia: Secondary | ICD-10-CM | POA: Diagnosis not present

## 2018-02-25 DIAGNOSIS — Z1211 Encounter for screening for malignant neoplasm of colon: Secondary | ICD-10-CM | POA: Diagnosis not present

## 2018-02-25 DIAGNOSIS — R1013 Epigastric pain: Secondary | ICD-10-CM | POA: Diagnosis not present

## 2018-02-26 DIAGNOSIS — F4312 Post-traumatic stress disorder, chronic: Secondary | ICD-10-CM | POA: Diagnosis not present

## 2018-02-26 DIAGNOSIS — F314 Bipolar disorder, current episode depressed, severe, without psychotic features: Secondary | ICD-10-CM | POA: Diagnosis not present

## 2018-02-26 DIAGNOSIS — F4010 Social phobia, unspecified: Secondary | ICD-10-CM | POA: Diagnosis not present

## 2018-03-05 DIAGNOSIS — F4010 Social phobia, unspecified: Secondary | ICD-10-CM | POA: Diagnosis not present

## 2018-03-05 DIAGNOSIS — F314 Bipolar disorder, current episode depressed, severe, without psychotic features: Secondary | ICD-10-CM | POA: Diagnosis not present

## 2018-03-05 DIAGNOSIS — F4312 Post-traumatic stress disorder, chronic: Secondary | ICD-10-CM | POA: Diagnosis not present

## 2018-03-06 DIAGNOSIS — Z713 Dietary counseling and surveillance: Secondary | ICD-10-CM | POA: Diagnosis not present

## 2018-03-07 DIAGNOSIS — L272 Dermatitis due to ingested food: Secondary | ICD-10-CM | POA: Diagnosis not present

## 2018-03-12 DIAGNOSIS — F314 Bipolar disorder, current episode depressed, severe, without psychotic features: Secondary | ICD-10-CM | POA: Diagnosis not present

## 2018-03-12 DIAGNOSIS — F4010 Social phobia, unspecified: Secondary | ICD-10-CM | POA: Diagnosis not present

## 2018-03-12 DIAGNOSIS — F4312 Post-traumatic stress disorder, chronic: Secondary | ICD-10-CM | POA: Diagnosis not present

## 2018-03-19 DIAGNOSIS — F4010 Social phobia, unspecified: Secondary | ICD-10-CM | POA: Diagnosis not present

## 2018-03-19 DIAGNOSIS — F4312 Post-traumatic stress disorder, chronic: Secondary | ICD-10-CM | POA: Diagnosis not present

## 2018-03-19 DIAGNOSIS — F314 Bipolar disorder, current episode depressed, severe, without psychotic features: Secondary | ICD-10-CM | POA: Diagnosis not present

## 2018-03-22 DIAGNOSIS — K2930 Chronic superficial gastritis without bleeding: Secondary | ICD-10-CM | POA: Diagnosis not present

## 2018-03-22 DIAGNOSIS — K5730 Diverticulosis of large intestine without perforation or abscess without bleeding: Secondary | ICD-10-CM | POA: Diagnosis not present

## 2018-03-22 DIAGNOSIS — Z1211 Encounter for screening for malignant neoplasm of colon: Secondary | ICD-10-CM | POA: Diagnosis not present

## 2018-03-22 DIAGNOSIS — D126 Benign neoplasm of colon, unspecified: Secondary | ICD-10-CM | POA: Diagnosis not present

## 2018-03-22 DIAGNOSIS — R1013 Epigastric pain: Secondary | ICD-10-CM | POA: Diagnosis not present

## 2018-03-22 DIAGNOSIS — K219 Gastro-esophageal reflux disease without esophagitis: Secondary | ICD-10-CM | POA: Diagnosis not present

## 2018-03-22 DIAGNOSIS — K2900 Acute gastritis without bleeding: Secondary | ICD-10-CM | POA: Diagnosis not present

## 2018-03-26 DIAGNOSIS — F4312 Post-traumatic stress disorder, chronic: Secondary | ICD-10-CM | POA: Diagnosis not present

## 2018-03-26 DIAGNOSIS — F4010 Social phobia, unspecified: Secondary | ICD-10-CM | POA: Diagnosis not present

## 2018-03-26 DIAGNOSIS — F314 Bipolar disorder, current episode depressed, severe, without psychotic features: Secondary | ICD-10-CM | POA: Diagnosis not present

## 2018-03-27 DIAGNOSIS — Z1211 Encounter for screening for malignant neoplasm of colon: Secondary | ICD-10-CM | POA: Diagnosis not present

## 2018-03-27 DIAGNOSIS — K219 Gastro-esophageal reflux disease without esophagitis: Secondary | ICD-10-CM | POA: Diagnosis not present

## 2018-03-27 DIAGNOSIS — K2930 Chronic superficial gastritis without bleeding: Secondary | ICD-10-CM | POA: Diagnosis not present

## 2018-03-27 DIAGNOSIS — D126 Benign neoplasm of colon, unspecified: Secondary | ICD-10-CM | POA: Diagnosis not present

## 2018-03-27 DIAGNOSIS — Z713 Dietary counseling and surveillance: Secondary | ICD-10-CM | POA: Diagnosis not present

## 2018-04-02 DIAGNOSIS — F4010 Social phobia, unspecified: Secondary | ICD-10-CM | POA: Diagnosis not present

## 2018-04-02 DIAGNOSIS — F314 Bipolar disorder, current episode depressed, severe, without psychotic features: Secondary | ICD-10-CM | POA: Diagnosis not present

## 2018-04-02 DIAGNOSIS — F4312 Post-traumatic stress disorder, chronic: Secondary | ICD-10-CM | POA: Diagnosis not present

## 2018-04-08 DIAGNOSIS — Z713 Dietary counseling and surveillance: Secondary | ICD-10-CM | POA: Diagnosis not present

## 2018-04-09 DIAGNOSIS — F4010 Social phobia, unspecified: Secondary | ICD-10-CM | POA: Diagnosis not present

## 2018-04-09 DIAGNOSIS — Z713 Dietary counseling and surveillance: Secondary | ICD-10-CM | POA: Diagnosis not present

## 2018-04-09 DIAGNOSIS — F314 Bipolar disorder, current episode depressed, severe, without psychotic features: Secondary | ICD-10-CM | POA: Diagnosis not present

## 2018-04-09 DIAGNOSIS — F4312 Post-traumatic stress disorder, chronic: Secondary | ICD-10-CM | POA: Diagnosis not present

## 2018-04-16 DIAGNOSIS — F4010 Social phobia, unspecified: Secondary | ICD-10-CM | POA: Diagnosis not present

## 2018-04-16 DIAGNOSIS — F4312 Post-traumatic stress disorder, chronic: Secondary | ICD-10-CM | POA: Diagnosis not present

## 2018-04-16 DIAGNOSIS — F314 Bipolar disorder, current episode depressed, severe, without psychotic features: Secondary | ICD-10-CM | POA: Diagnosis not present

## 2018-04-23 DIAGNOSIS — F4010 Social phobia, unspecified: Secondary | ICD-10-CM | POA: Diagnosis not present

## 2018-04-23 DIAGNOSIS — F4312 Post-traumatic stress disorder, chronic: Secondary | ICD-10-CM | POA: Diagnosis not present

## 2018-04-23 DIAGNOSIS — F314 Bipolar disorder, current episode depressed, severe, without psychotic features: Secondary | ICD-10-CM | POA: Diagnosis not present

## 2018-04-29 DIAGNOSIS — Z713 Dietary counseling and surveillance: Secondary | ICD-10-CM | POA: Diagnosis not present

## 2018-05-02 DIAGNOSIS — M545 Low back pain: Secondary | ICD-10-CM | POA: Diagnosis not present

## 2018-05-07 DIAGNOSIS — F4312 Post-traumatic stress disorder, chronic: Secondary | ICD-10-CM | POA: Diagnosis not present

## 2018-05-07 DIAGNOSIS — F4010 Social phobia, unspecified: Secondary | ICD-10-CM | POA: Diagnosis not present

## 2018-05-07 DIAGNOSIS — F314 Bipolar disorder, current episode depressed, severe, without psychotic features: Secondary | ICD-10-CM | POA: Diagnosis not present

## 2018-05-17 DIAGNOSIS — M545 Low back pain: Secondary | ICD-10-CM | POA: Diagnosis not present

## 2018-05-17 DIAGNOSIS — Z981 Arthrodesis status: Secondary | ICD-10-CM | POA: Diagnosis not present

## 2018-05-28 DIAGNOSIS — F4010 Social phobia, unspecified: Secondary | ICD-10-CM | POA: Diagnosis not present

## 2018-05-28 DIAGNOSIS — F4312 Post-traumatic stress disorder, chronic: Secondary | ICD-10-CM | POA: Diagnosis not present

## 2018-05-28 DIAGNOSIS — F314 Bipolar disorder, current episode depressed, severe, without psychotic features: Secondary | ICD-10-CM | POA: Diagnosis not present

## 2018-06-03 DIAGNOSIS — F314 Bipolar disorder, current episode depressed, severe, without psychotic features: Secondary | ICD-10-CM | POA: Diagnosis not present

## 2018-06-04 DIAGNOSIS — F314 Bipolar disorder, current episode depressed, severe, without psychotic features: Secondary | ICD-10-CM | POA: Diagnosis not present

## 2018-06-05 DIAGNOSIS — F314 Bipolar disorder, current episode depressed, severe, without psychotic features: Secondary | ICD-10-CM | POA: Diagnosis not present

## 2018-06-06 DIAGNOSIS — F314 Bipolar disorder, current episode depressed, severe, without psychotic features: Secondary | ICD-10-CM | POA: Diagnosis not present

## 2018-06-07 DIAGNOSIS — F314 Bipolar disorder, current episode depressed, severe, without psychotic features: Secondary | ICD-10-CM | POA: Diagnosis not present

## 2018-06-10 DIAGNOSIS — F315 Bipolar disorder, current episode depressed, severe, with psychotic features: Secondary | ICD-10-CM | POA: Diagnosis not present

## 2018-06-12 DIAGNOSIS — F315 Bipolar disorder, current episode depressed, severe, with psychotic features: Secondary | ICD-10-CM | POA: Diagnosis not present

## 2018-06-14 DIAGNOSIS — F315 Bipolar disorder, current episode depressed, severe, with psychotic features: Secondary | ICD-10-CM | POA: Diagnosis not present

## 2018-06-17 DIAGNOSIS — F315 Bipolar disorder, current episode depressed, severe, with psychotic features: Secondary | ICD-10-CM | POA: Diagnosis not present

## 2018-06-18 DIAGNOSIS — F315 Bipolar disorder, current episode depressed, severe, with psychotic features: Secondary | ICD-10-CM | POA: Diagnosis not present

## 2018-06-19 DIAGNOSIS — F315 Bipolar disorder, current episode depressed, severe, with psychotic features: Secondary | ICD-10-CM | POA: Diagnosis not present

## 2018-06-20 DIAGNOSIS — F315 Bipolar disorder, current episode depressed, severe, with psychotic features: Secondary | ICD-10-CM | POA: Diagnosis not present

## 2018-06-21 DIAGNOSIS — F315 Bipolar disorder, current episode depressed, severe, with psychotic features: Secondary | ICD-10-CM | POA: Diagnosis not present

## 2018-06-24 DIAGNOSIS — F315 Bipolar disorder, current episode depressed, severe, with psychotic features: Secondary | ICD-10-CM | POA: Diagnosis not present

## 2018-06-25 DIAGNOSIS — F315 Bipolar disorder, current episode depressed, severe, with psychotic features: Secondary | ICD-10-CM | POA: Diagnosis not present

## 2018-06-26 DIAGNOSIS — F315 Bipolar disorder, current episode depressed, severe, with psychotic features: Secondary | ICD-10-CM | POA: Diagnosis not present

## 2018-06-27 DIAGNOSIS — F315 Bipolar disorder, current episode depressed, severe, with psychotic features: Secondary | ICD-10-CM | POA: Diagnosis not present

## 2018-06-28 DIAGNOSIS — F315 Bipolar disorder, current episode depressed, severe, with psychotic features: Secondary | ICD-10-CM | POA: Diagnosis not present

## 2018-07-01 DIAGNOSIS — F315 Bipolar disorder, current episode depressed, severe, with psychotic features: Secondary | ICD-10-CM | POA: Diagnosis not present

## 2018-07-02 DIAGNOSIS — F315 Bipolar disorder, current episode depressed, severe, with psychotic features: Secondary | ICD-10-CM | POA: Diagnosis not present

## 2018-07-02 DIAGNOSIS — F314 Bipolar disorder, current episode depressed, severe, without psychotic features: Secondary | ICD-10-CM | POA: Diagnosis not present

## 2018-07-02 NOTE — Progress Notes (Signed)
[facility]  PATIENT: Angelica [name] DOB: 04/05/1966   REASON FOR VISIT: Follow-up for migraine HISTORY FROM: Patient    HISTORY OF PRESENT ILLNESS:   Angelica [name] is a 52 years old left-handed female,, seen in refer by her primary care physician Dr. Audley Hose and pain management Dr. Dian Situ for evaluation of bilateral hands tremor  I have reviewed and summarized her most recent office visit, she has past medical history of PTSD, bipolar disorder, on polypharmacy treatment, including Wellbutrin, lithium, Zoloft, Topamax, chronic migraine headaches,  She had lumbar decompression surgery in May 2016 by Dr. Laverta Baltimore at [facility],  she choose to have surgery at [facility] to be close to her parents, prior to the surgery, she complains of chronic low back pain for more than a decade, radiating pain to right lower extremity, failed to repeat epidural injection,  Surgery did help her low back pain, but she continue have gait difficulty, which is a continuation of her chronic problem, she is now having mild right lateral thigh and deep achy pain, denied left leg symptoms, She denies bowel and bladder incontinence, but she continue have unsteady gait," as if I have forgot what is like to walk normal"  She also complains of few years history of bilateral hands tremor, intermittent, denies family history of tremor, was diagnosed with bipolar disorder since 23, was treated with Lithium at variable time  I have reviewed EMG nerve conduction study in September 22nd 2016 [facility], bilateral peroneal, and tibial motor responses were normal. Bilateral tibial H reflexes were normal and symmetric, left superficial peroneal sensory response was absent. Right superficial peroneal sensory response was normal. Bilateral sural sensory responses were normal. Left peroneal to EDB motor response showed normal distal  latency, C map amplitude, conduction velocity, with absent F-wave latency.  Selected needle examination of bilateral lower extremity muscles and bilateral lumbar sacral paraspinal muscles were normal I have personally reviewed MRI of lumbar in August 2016: 1. Lumbar fusion and right laminectomy at L4-5 without residual or recurrent stenosis. No other significant disc protrusion or focal stenosis.  UPDATE March 13 2016: She continue to have mild bilateral hands posturing tremor, is on higher dose of lithium now for her bipolar disorder, she continue complaining significant low back pain, bilateral lower extremity pain, gait abnormality,  UPDATE Jan 03 2018: Her gait abnormality has much improved with physical therapy, today she complains of new onset headaches, left side severe pounding headache with associated light noise sensitivity, nauseous, lasting for hours, she has tried over-the-counter medications, with limited help, she also complains of visual distortion, usually at her peripheral visual field, bright light, sometimes distortion of the peripheral visual field,  She has a history of migraine, but only happened occasionally in the past, she is going through menopause.  She is also on polypharmacy treatment for her bipolar disorder, enjoys painting as her hobby. UPDATE 7/24/2019CM [name], 52 year old female returns for follow-up with history of migraine headaches which have been much improved with Topamax being increased to 200 mg daily she also has a history of bipolar disorder and has had some recent adjustments to her medication.  She follows with psychiatry at [facility].  She takes Maxalt acutely and Zofran for nausea if necessary.  She will be traveling to [place] in September to show some of her art work.  She returns for reevaluation REVIEW OF SYSTEMS: Full 14 system review of systems performed  and notable only for those listed, all others are neg:  Constitutional:  Fatigue Cardiovascular: neg Ear/Nose/Throat: neg  Skin: neg Eyes: neg Respiratory: neg Gastroitestinal: neg  Hematology/Lymphatic: neg  Endocrine: neg Musculoskeletal:neg Allergy/Immunology: neg Neurological: Headaches Psychiatric: Decreased concentration depression anxiety hyperactivity Sleep : Insomnia   ALLERGIES: Allergies  Allergen Reactions  . Other Swelling    Insect bites  . Propofol Other (See Comments)    Acts bizarre upon waking up after anesthesia '  . Bee Venom Itching, Swelling and Other (See Comments)    Flu like symptoms  . Lamictal [Lamotrigine] Rash    HOME MEDICATIONS: Outpatient Medications Prior to Visit  Medication Sig Dispense Refill  . atomoxetine (STRATTERA) 80 MG capsule Take 80 mg by mouth daily.    . busPIRone (BUSPAR) 30 MG tablet Take 30 mg by mouth 2 (two) times daily.    . fluticasone (FLONASE) 50 MCG/ACT nasal spray Place 2 sprays into both nostrils daily as needed for allergies or rhinitis.     Marland Kitchen levalbuterol (XOPENEX HFA) 45 MCG/ACT inhaler Inhale 2 puffs into the lungs every 4 (four) hours as needed (asthma symptoms).    Marland Kitchen lithium carbonate 300 MG capsule Take 300mg  in am and 600mg  in pm.    . lurasidone (LATUDA) 80 MG TABS tablet Take 160 mg by mouth at bedtime.    . meclizine (ANTIVERT) 25 MG tablet Take 25 mg by mouth 2 (two) times daily as needed for dizziness (motion sickness).     . metoprolol succinate (TOPROL-XL) 25 MG 24 hr tablet Take 25 mg by mouth 2 (two) times daily.   3  . montelukast (SINGULAIR) 10 MG tablet Take 10 mg by mouth at bedtime.   10  . ondansetron (ZOFRAN ODT) 4 MG disintegrating tablet Take 1 tablet (4 mg total) by mouth every 8 (eight) hours as needed. 20 tablet 6  . rizatriptan (MAXALT-MLT) 10 MG disintegrating tablet Take 1 tablet (10 mg total) by mouth as needed. May repeat in 2 hours if needed 12 tablet 6  . sertraline (ZOLOFT) 100 MG tablet Take 200 mg by mouth daily.   10  . topiramate (TOPAMAX) 100 MG  tablet Take 2 tablets (200 mg total) by mouth at bedtime. 60 tablet 11  . traZODone (DESYREL) 100 MG tablet Take 100 mg by mouth at bedtime.   2  . VRAYLAR capsule 1.5 mg daily.  0  . aspirin-acetaminophen-caffeine (EXCEDRIN MIGRAINE) 947-096-28 MG per tablet Take 2 tablets by mouth 2 (two) times daily as needed (back pain).    Marland Kitchen buPROPion (WELLBUTRIN XL) 150 MG 24 hr tablet Take 150 mg by mouth daily.  2  . Fluticasone Furoate-Vilanterol (BREO ELLIPTA) 100-25 MCG/INH AEPB Inhale 1 puff into the lungs daily.    . metFORMIN (GLUCOPHAGE) 500 MG tablet Take 500 mg by mouth 2 (two) times daily with a meal.     No facility-administered medications prior to visit.     PAST MEDICAL HISTORY: Past Medical History:  Diagnosis Date  . ADHD (attention deficit hyperactivity disorder)   . Alcoholism ([place])   . Anxiety   . Asthma   . Back pain   . Barrett's esophagus   . Bipolar disorder ([place])   . CIN I (cervical intraepithelial neoplasia I)   . COPD (chronic obstructive pulmonary disease) ([place])   . COPD (chronic obstructive pulmonary disease) ([place])   . Emphysema of lung ([place])   . Hypertension   . Migraine   . PCOS (polycystic ovarian syndrome)   .  PMDD (premenstrual dysphoric disorder)   . PTSD (post-traumatic stress disorder)   . Vertigo   . Vitamin D deficiency 05/2017    PAST SURGICAL HISTORY: Past Surgical History:  Procedure Laterality Date  . BACK SURGERY     Spinal fusion - lumbar 2016  . CERVICAL BIOPSY  W/ LOOP ELECTRODE EXCISION  1995   CIN l  . COLPOSCOPY  1995   CIN l  . MOUTH SURGERY    . NASAL SEPTUM SURGERY    . TONSILECTOMY, ADENOIDECTOMY, BILATERAL MYRINGOTOMY AND TUBES      FAMILY HISTORY: Family History  Problem Relation Age of Onset  . Breast cancer Mother   . Hypertension Father   . Cancer Father        prostate  . Diabetes Maternal Grandmother   . Bipolar disorder Maternal Grandmother   . Breast cancer Paternal Grandmother   . Bipolar disorder Paternal  Grandmother   . Bipolar disorder Maternal Grandfather   . Bipolar disorder Paternal Grandfather     SOCIAL HISTORY: Social History   Socioeconomic History  . Marital status: Divorced    Spouse name: Not on file  . Number of children: 0  . Years of education: 4  . Highest education level: Not on file  Occupational History  . Occupation: Ship broker  Social Needs  . Financial resource strain: Not on file  . Food insecurity:    Worry: Not on file    Inability: Not on file  . Transportation needs:    Medical: Not on file    Non-medical: Not on file  Tobacco Use  . Smoking status: Former Smoker    Packs/day: 1.50    Types: Cigarettes  . Smokeless tobacco: Never Used  Substance and Sexual Activity  . Alcohol use: Yes    Alcohol/week: 0.0 oz    Comment: Rarely - only drinks once or twice per year. Previous problem with alcohol.  . Drug use: Yes    Comment: Previously problem with overuse of pain meds and clonazepam.  . Sexual activity: Yes    Birth control/protection: None  Lifestyle  . Physical activity:    Days per week: Not on file    Minutes per session: Not on file  . Stress: Not on file  Relationships  . Social connections:    Talks on phone: Not on file    Gets together: Not on file    Attends religious service: Not on file    Active member of club or organization: Not on file    Attends meetings of clubs or organizations: Not on file    Relationship status: Not on file  . Intimate partner violence:    Fear of current or ex partner: Not on file    Emotionally abused: Not on file    Physically abused: Not on file    Forced sexual activity: Not on file  Other Topics Concern  . Not on file  Social History Narrative   Lives at home alone.   Left-handed.   3-5 cups caffeine per day.     PHYSICAL EXAM  Vitals:   07/03/18 1459  BP: 125/70  Pulse: 72  Weight: 241 lb 12.8 oz (109.7 kg)  Height: 5\' 6"  (1.676 m)   Body mass index is 39.03  kg/m.  Generalized: Well developed, in no acute distress  Head: normocephalic and atraumatic,. Oropharynx benign  Neck: Supple,  Musculoskeletal: No deformity   Neurological examination   Mentation: Alert oriented to time, place, history taking.  Attention span and concentration appropriate. Recent and remote memory intact.  Follows all commands speech and language fluent.   Cranial nerve II-XII: .Pupils were equal round reactive to light extraocular movements were full, visual field were full on confrontational test. Facial sensation and strength were normal. hearing was intact to finger rubbing bilaterally. Uvula tongue midline. head turning and shoulder shrug were normal and symmetric.Tongue protrusion into cheek strength was normal. Motor: normal bulk and tone, full strength in the BUE, BLE,  Sensory: normal and symmetric to light touch,  Coordination: finger-nose-finger, heel-to-shin bilaterally, no dysmetria Reflexes: Symmetric upper and lower, plantar responses were flexor bilaterally. Gait and Station: Rising up from seated position without assistance, normal stance,  moderate stride, good arm swing, smooth turning, able to perform tiptoe, and heel walking without difficulty. Tandem gait is steady  DIAGNOSTIC DATA (LABS, IMAGING, TESTING) - I reviewed patient records, labs, notes, testing and imaging myself where available.  Lab Results  Component Value Date   WBC 10.7 05/16/2017   HGB 13.9 05/16/2017   HCT 43.1 05/16/2017   MCV 93.3 05/16/2017   PLT 391 05/16/2017      Component Value Date/Time   NA 138 07/25/2015 1940   K 3.6 07/25/2015 1940   CL 103 07/25/2015 1940   CO2 27 07/25/2015 1940   GLUCOSE 99 07/25/2015 1940   BUN 8 07/25/2015 1940   CREATININE 0.76 07/25/2015 1940   CALCIUM 9.3 07/25/2015 1940   PROT 7.0 07/29/2011 1828   ALBUMIN 3.6 07/29/2011 1828   AST 11 07/29/2011 1828   ALT 16 07/29/2011 1828   ALKPHOS 84 07/29/2011 1828   BILITOT 0.2 (L)  07/29/2011 1828   GFRNONAA >60 07/25/2015 1940   GFRAA >60 07/25/2015 1940   Lab Results  Component Value Date   CHOL  07/08/2009    174        ATP III CLASSIFICATION:  <200     mg/dL   Desirable  200-239  mg/dL   Borderline High  >=240    mg/dL   High          HDL 45 07/08/2009   LDLCALC (H) 07/08/2009    111        Total Cholesterol/HDL:CHD Risk Coronary Heart Disease Risk Table                     Men   Women  1/2 Average Risk   3.4   3.3  Average Risk       5.0   4.4  2 X Average Risk   9.6   7.1  3 X Average Risk  23.4   11.0        Use the calculated Patient Ratio above and the CHD Risk Table to determine the patient's CHD Risk.        ATP III CLASSIFICATION (LDL):  <100     mg/dL   Optimal  100-129  mg/dL   Near or Above                    Optimal  130-159  mg/dL   Borderline  160-189  mg/dL   High  >190     mg/dL   Very High   TRIG 90 07/08/2009   CHOLHDL 3.9 07/08/2009   Lab Results  Component Value Date   HGBA1C 5.8 (H) 07/14/2014   No results found for: [id number] Lab Results  Component Value Date   TSH 1.46 05/16/2017  ASSESSMENT AND PLAN    ELIZABTH PALKA is a 52 y.o. female  with history of chronic migraine headaches currently doing well with Topamax 200 mg daily.She also has a history of bipolar disorder, see psychiatry in [facility] and has had recent changes to her medications.   PLAN: Continue Topamax 200 mg at night Continue Maxalt acutely we will refill Continue Zofran as needed nausea Follow-up 8 months Dennie Bible, [facility], APRN  [facility] [address], [place], [place] [address] [phone]

## 2018-07-03 ENCOUNTER — Encounter: Payer: Self-pay | Admitting: Nurse Practitioner

## 2018-07-03 ENCOUNTER — Telehealth: Payer: Self-pay | Admitting: Nurse Practitioner

## 2018-07-03 ENCOUNTER — Ambulatory Visit: Payer: BLUE CROSS/BLUE SHIELD | Admitting: Nurse Practitioner

## 2018-07-03 VITALS — BP 125/70 | HR 72 | Ht 66.0 in | Wt 241.8 lb

## 2018-07-03 DIAGNOSIS — F4010 Social phobia, unspecified: Secondary | ICD-10-CM | POA: Diagnosis not present

## 2018-07-03 DIAGNOSIS — G43009 Migraine without aura, not intractable, without status migrainosus: Secondary | ICD-10-CM

## 2018-07-03 DIAGNOSIS — F314 Bipolar disorder, current episode depressed, severe, without psychotic features: Secondary | ICD-10-CM | POA: Diagnosis not present

## 2018-07-03 DIAGNOSIS — F4312 Post-traumatic stress disorder, chronic: Secondary | ICD-10-CM | POA: Diagnosis not present

## 2018-07-03 MED ORDER — RIZATRIPTAN BENZOATE 10 MG PO TBDP: 10.0000 mg | ORAL_TABLET | ORAL | 8 refills | Status: AC | PRN

## 2018-07-03 MED ORDER — ONDANSETRON 4 MG PO TBDP: 4.0000 mg | ORAL_TABLET | Freq: Three times a day (TID) | ORAL | 6 refills | Status: AC | PRN

## 2018-07-03 NOTE — Telephone Encounter (Signed)
Pt. States she will call us back to schedule her 8 mo f/u per Hassell Done, NP.

## 2018-07-03 NOTE — Patient Instructions (Signed)
Continue Topamax 200 mg at night Continue Maxalt acutely we will refill Continue Zofran as needed nausea Follow-up 8 months

## 2018-07-04 DIAGNOSIS — F315 Bipolar disorder, current episode depressed, severe, with psychotic features: Secondary | ICD-10-CM | POA: Diagnosis not present

## 2018-07-04 DIAGNOSIS — F314 Bipolar disorder, current episode depressed, severe, without psychotic features: Secondary | ICD-10-CM | POA: Diagnosis not present

## 2018-07-05 DIAGNOSIS — F315 Bipolar disorder, current episode depressed, severe, with psychotic features: Secondary | ICD-10-CM | POA: Diagnosis not present

## 2018-07-05 DIAGNOSIS — F314 Bipolar disorder, current episode depressed, severe, without psychotic features: Secondary | ICD-10-CM | POA: Diagnosis not present

## 2018-07-05 NOTE — Progress Notes (Signed)
I have reviewed and agreed above plan. 

## 2018-07-08 DIAGNOSIS — F315 Bipolar disorder, current episode depressed, severe, with psychotic features: Secondary | ICD-10-CM | POA: Diagnosis not present

## 2018-07-08 DIAGNOSIS — F314 Bipolar disorder, current episode depressed, severe, without psychotic features: Secondary | ICD-10-CM | POA: Diagnosis not present

## 2018-07-09 DIAGNOSIS — F4312 Post-traumatic stress disorder, chronic: Secondary | ICD-10-CM | POA: Diagnosis not present

## 2018-07-09 DIAGNOSIS — F4010 Social phobia, unspecified: Secondary | ICD-10-CM | POA: Diagnosis not present

## 2018-07-09 DIAGNOSIS — F314 Bipolar disorder, current episode depressed, severe, without psychotic features: Secondary | ICD-10-CM | POA: Diagnosis not present

## 2018-07-10 DIAGNOSIS — F314 Bipolar disorder, current episode depressed, severe, without psychotic features: Secondary | ICD-10-CM | POA: Diagnosis not present

## 2018-07-10 DIAGNOSIS — F315 Bipolar disorder, current episode depressed, severe, with psychotic features: Secondary | ICD-10-CM | POA: Diagnosis not present

## 2018-07-11 DIAGNOSIS — F315 Bipolar disorder, current episode depressed, severe, with psychotic features: Secondary | ICD-10-CM | POA: Diagnosis not present

## 2018-07-11 DIAGNOSIS — F314 Bipolar disorder, current episode depressed, severe, without psychotic features: Secondary | ICD-10-CM | POA: Diagnosis not present

## 2018-07-15 DIAGNOSIS — F315 Bipolar disorder, current episode depressed, severe, with psychotic features: Secondary | ICD-10-CM | POA: Diagnosis not present

## 2018-07-15 DIAGNOSIS — F314 Bipolar disorder, current episode depressed, severe, without psychotic features: Secondary | ICD-10-CM | POA: Diagnosis not present

## 2018-07-16 DIAGNOSIS — F4010 Social phobia, unspecified: Secondary | ICD-10-CM | POA: Diagnosis not present

## 2018-07-16 DIAGNOSIS — F4312 Post-traumatic stress disorder, chronic: Secondary | ICD-10-CM | POA: Diagnosis not present

## 2018-07-16 DIAGNOSIS — F314 Bipolar disorder, current episode depressed, severe, without psychotic features: Secondary | ICD-10-CM | POA: Diagnosis not present

## 2018-07-17 DIAGNOSIS — F315 Bipolar disorder, current episode depressed, severe, with psychotic features: Secondary | ICD-10-CM | POA: Diagnosis not present

## 2018-07-17 DIAGNOSIS — F314 Bipolar disorder, current episode depressed, severe, without psychotic features: Secondary | ICD-10-CM | POA: Diagnosis not present

## 2018-07-18 DIAGNOSIS — F314 Bipolar disorder, current episode depressed, severe, without psychotic features: Secondary | ICD-10-CM | POA: Diagnosis not present

## 2018-07-18 DIAGNOSIS — F315 Bipolar disorder, current episode depressed, severe, with psychotic features: Secondary | ICD-10-CM | POA: Diagnosis not present

## 2018-07-22 DIAGNOSIS — F314 Bipolar disorder, current episode depressed, severe, without psychotic features: Secondary | ICD-10-CM | POA: Diagnosis not present

## 2018-07-22 DIAGNOSIS — F315 Bipolar disorder, current episode depressed, severe, with psychotic features: Secondary | ICD-10-CM | POA: Diagnosis not present

## 2018-07-24 DIAGNOSIS — F315 Bipolar disorder, current episode depressed, severe, with psychotic features: Secondary | ICD-10-CM | POA: Diagnosis not present

## 2018-07-24 DIAGNOSIS — F314 Bipolar disorder, current episode depressed, severe, without psychotic features: Secondary | ICD-10-CM | POA: Diagnosis not present

## 2018-07-26 DIAGNOSIS — F314 Bipolar disorder, current episode depressed, severe, without psychotic features: Secondary | ICD-10-CM | POA: Diagnosis not present

## 2018-07-26 DIAGNOSIS — F315 Bipolar disorder, current episode depressed, severe, with psychotic features: Secondary | ICD-10-CM | POA: Diagnosis not present

## 2018-07-30 DIAGNOSIS — F314 Bipolar disorder, current episode depressed, severe, without psychotic features: Secondary | ICD-10-CM | POA: Diagnosis not present

## 2018-07-30 DIAGNOSIS — F4312 Post-traumatic stress disorder, chronic: Secondary | ICD-10-CM | POA: Diagnosis not present

## 2018-07-30 DIAGNOSIS — F4010 Social phobia, unspecified: Secondary | ICD-10-CM | POA: Diagnosis not present

## 2018-07-31 DIAGNOSIS — F315 Bipolar disorder, current episode depressed, severe, with psychotic features: Secondary | ICD-10-CM | POA: Diagnosis not present

## 2018-07-31 DIAGNOSIS — F314 Bipolar disorder, current episode depressed, severe, without psychotic features: Secondary | ICD-10-CM | POA: Diagnosis not present

## 2018-08-01 ENCOUNTER — Encounter: Payer: Self-pay | Admitting: Nurse Practitioner

## 2018-08-01 DIAGNOSIS — Z79899 Other long term (current) drug therapy: Secondary | ICD-10-CM | POA: Diagnosis not present

## 2018-08-01 DIAGNOSIS — E559 Vitamin D deficiency, unspecified: Secondary | ICD-10-CM | POA: Diagnosis not present

## 2018-08-01 DIAGNOSIS — R7303 Prediabetes: Secondary | ICD-10-CM | POA: Diagnosis not present

## 2018-08-01 DIAGNOSIS — E041 Nontoxic single thyroid nodule: Secondary | ICD-10-CM | POA: Diagnosis not present

## 2018-08-01 DIAGNOSIS — F315 Bipolar disorder, current episode depressed, severe, with psychotic features: Secondary | ICD-10-CM | POA: Diagnosis not present

## 2018-08-01 DIAGNOSIS — E538 Deficiency of other specified B group vitamins: Secondary | ICD-10-CM | POA: Diagnosis not present

## 2018-08-01 DIAGNOSIS — R079 Chest pain, unspecified: Secondary | ICD-10-CM | POA: Diagnosis not present

## 2018-08-01 DIAGNOSIS — F314 Bipolar disorder, current episode depressed, severe, without psychotic features: Secondary | ICD-10-CM | POA: Diagnosis not present

## 2018-08-02 DIAGNOSIS — F314 Bipolar disorder, current episode depressed, severe, without psychotic features: Secondary | ICD-10-CM | POA: Diagnosis not present

## 2018-08-02 DIAGNOSIS — F315 Bipolar disorder, current episode depressed, severe, with psychotic features: Secondary | ICD-10-CM | POA: Diagnosis not present

## 2018-08-05 DIAGNOSIS — F314 Bipolar disorder, current episode depressed, severe, without psychotic features: Secondary | ICD-10-CM | POA: Diagnosis not present

## 2018-08-05 DIAGNOSIS — F315 Bipolar disorder, current episode depressed, severe, with psychotic features: Secondary | ICD-10-CM | POA: Diagnosis not present

## 2018-08-06 DIAGNOSIS — F314 Bipolar disorder, current episode depressed, severe, without psychotic features: Secondary | ICD-10-CM | POA: Diagnosis not present

## 2018-08-06 DIAGNOSIS — F315 Bipolar disorder, current episode depressed, severe, with psychotic features: Secondary | ICD-10-CM | POA: Diagnosis not present

## 2018-08-13 DIAGNOSIS — F314 Bipolar disorder, current episode depressed, severe, without psychotic features: Secondary | ICD-10-CM | POA: Diagnosis not present

## 2018-08-13 DIAGNOSIS — F4010 Social phobia, unspecified: Secondary | ICD-10-CM | POA: Diagnosis not present

## 2018-08-13 DIAGNOSIS — F4312 Post-traumatic stress disorder, chronic: Secondary | ICD-10-CM | POA: Diagnosis not present

## 2018-08-14 DIAGNOSIS — F315 Bipolar disorder, current episode depressed, severe, with psychotic features: Secondary | ICD-10-CM | POA: Diagnosis not present

## 2018-08-15 DIAGNOSIS — F315 Bipolar disorder, current episode depressed, severe, with psychotic features: Secondary | ICD-10-CM | POA: Diagnosis not present

## 2018-08-16 DIAGNOSIS — F315 Bipolar disorder, current episode depressed, severe, with psychotic features: Secondary | ICD-10-CM | POA: Diagnosis not present

## 2018-08-19 DIAGNOSIS — F315 Bipolar disorder, current episode depressed, severe, with psychotic features: Secondary | ICD-10-CM | POA: Diagnosis not present

## 2018-08-20 DIAGNOSIS — J019 Acute sinusitis, unspecified: Secondary | ICD-10-CM | POA: Diagnosis not present

## 2018-08-22 DIAGNOSIS — G43809 Other migraine, not intractable, without status migrainosus: Secondary | ICD-10-CM | POA: Diagnosis not present

## 2018-09-06 DIAGNOSIS — F332 Major depressive disorder, recurrent severe without psychotic features: Secondary | ICD-10-CM | POA: Diagnosis not present

## 2018-09-10 DIAGNOSIS — F4010 Social phobia, unspecified: Secondary | ICD-10-CM | POA: Diagnosis not present

## 2018-09-10 DIAGNOSIS — F4312 Post-traumatic stress disorder, chronic: Secondary | ICD-10-CM | POA: Diagnosis not present

## 2018-09-10 DIAGNOSIS — F314 Bipolar disorder, current episode depressed, severe, without psychotic features: Secondary | ICD-10-CM | POA: Diagnosis not present

## 2018-09-13 DIAGNOSIS — F332 Major depressive disorder, recurrent severe without psychotic features: Secondary | ICD-10-CM | POA: Diagnosis not present

## 2018-09-17 DIAGNOSIS — F314 Bipolar disorder, current episode depressed, severe, without psychotic features: Secondary | ICD-10-CM | POA: Diagnosis not present

## 2018-09-17 DIAGNOSIS — F4010 Social phobia, unspecified: Secondary | ICD-10-CM | POA: Diagnosis not present

## 2018-09-17 DIAGNOSIS — F4312 Post-traumatic stress disorder, chronic: Secondary | ICD-10-CM | POA: Diagnosis not present

## 2018-09-19 DIAGNOSIS — R631 Polydipsia: Secondary | ICD-10-CM | POA: Diagnosis not present

## 2018-09-19 DIAGNOSIS — Z79899 Other long term (current) drug therapy: Secondary | ICD-10-CM | POA: Diagnosis not present

## 2018-09-19 DIAGNOSIS — Z23 Encounter for immunization: Secondary | ICD-10-CM | POA: Diagnosis not present

## 2018-09-19 DIAGNOSIS — R197 Diarrhea, unspecified: Secondary | ICD-10-CM | POA: Diagnosis not present

## 2018-09-27 DIAGNOSIS — F4312 Post-traumatic stress disorder, chronic: Secondary | ICD-10-CM | POA: Diagnosis not present

## 2018-09-27 DIAGNOSIS — F314 Bipolar disorder, current episode depressed, severe, without psychotic features: Secondary | ICD-10-CM | POA: Diagnosis not present

## 2018-09-27 DIAGNOSIS — F4010 Social phobia, unspecified: Secondary | ICD-10-CM | POA: Diagnosis not present

## 2018-10-01 DIAGNOSIS — F4010 Social phobia, unspecified: Secondary | ICD-10-CM | POA: Diagnosis not present

## 2018-10-01 DIAGNOSIS — F314 Bipolar disorder, current episode depressed, severe, without psychotic features: Secondary | ICD-10-CM | POA: Diagnosis not present

## 2018-10-01 DIAGNOSIS — F4312 Post-traumatic stress disorder, chronic: Secondary | ICD-10-CM | POA: Diagnosis not present

## 2018-10-14 DIAGNOSIS — F332 Major depressive disorder, recurrent severe without psychotic features: Secondary | ICD-10-CM | POA: Diagnosis not present

## 2018-10-15 DIAGNOSIS — R631 Polydipsia: Secondary | ICD-10-CM | POA: Diagnosis not present

## 2018-10-15 DIAGNOSIS — J45901 Unspecified asthma with (acute) exacerbation: Secondary | ICD-10-CM | POA: Diagnosis not present

## 2018-10-15 DIAGNOSIS — J019 Acute sinusitis, unspecified: Secondary | ICD-10-CM | POA: Diagnosis not present

## 2018-10-15 DIAGNOSIS — Z79899 Other long term (current) drug therapy: Secondary | ICD-10-CM | POA: Diagnosis not present

## 2018-10-22 DIAGNOSIS — F4010 Social phobia, unspecified: Secondary | ICD-10-CM | POA: Diagnosis not present

## 2018-10-22 DIAGNOSIS — F4312 Post-traumatic stress disorder, chronic: Secondary | ICD-10-CM | POA: Diagnosis not present

## 2018-10-22 DIAGNOSIS — F314 Bipolar disorder, current episode depressed, severe, without psychotic features: Secondary | ICD-10-CM | POA: Diagnosis not present

## 2018-10-28 DIAGNOSIS — F332 Major depressive disorder, recurrent severe without psychotic features: Secondary | ICD-10-CM | POA: Diagnosis not present

## 2018-10-29 DIAGNOSIS — F314 Bipolar disorder, current episode depressed, severe, without psychotic features: Secondary | ICD-10-CM | POA: Diagnosis not present

## 2018-10-29 DIAGNOSIS — F4312 Post-traumatic stress disorder, chronic: Secondary | ICD-10-CM | POA: Diagnosis not present

## 2018-10-29 DIAGNOSIS — F4010 Social phobia, unspecified: Secondary | ICD-10-CM | POA: Diagnosis not present

## 2018-10-30 DIAGNOSIS — Z79899 Other long term (current) drug therapy: Secondary | ICD-10-CM | POA: Diagnosis not present

## 2018-10-30 DIAGNOSIS — R7303 Prediabetes: Secondary | ICD-10-CM | POA: Diagnosis not present

## 2018-10-30 DIAGNOSIS — R631 Polydipsia: Secondary | ICD-10-CM | POA: Diagnosis not present

## 2018-11-06 DIAGNOSIS — F315 Bipolar disorder, current episode depressed, severe, with psychotic features: Secondary | ICD-10-CM | POA: Diagnosis not present

## 2018-11-08 DIAGNOSIS — F315 Bipolar disorder, current episode depressed, severe, with psychotic features: Secondary | ICD-10-CM | POA: Diagnosis not present

## 2018-11-12 DIAGNOSIS — F315 Bipolar disorder, current episode depressed, severe, with psychotic features: Secondary | ICD-10-CM | POA: Diagnosis not present

## 2018-11-13 DIAGNOSIS — F314 Bipolar disorder, current episode depressed, severe, without psychotic features: Secondary | ICD-10-CM | POA: Diagnosis not present

## 2018-11-13 DIAGNOSIS — F315 Bipolar disorder, current episode depressed, severe, with psychotic features: Secondary | ICD-10-CM | POA: Diagnosis not present

## 2018-11-14 DIAGNOSIS — F315 Bipolar disorder, current episode depressed, severe, with psychotic features: Secondary | ICD-10-CM | POA: Diagnosis not present

## 2018-11-15 DIAGNOSIS — F315 Bipolar disorder, current episode depressed, severe, with psychotic features: Secondary | ICD-10-CM | POA: Diagnosis not present

## 2018-11-18 ENCOUNTER — Telehealth: Payer: Self-pay | Admitting: Nurse Practitioner

## 2018-11-18 DIAGNOSIS — F315 Bipolar disorder, current episode depressed, severe, with psychotic features: Secondary | ICD-10-CM | POA: Diagnosis not present

## 2018-11-18 NOTE — Telephone Encounter (Signed)
Pt called stating that medication topiramate (TOPAMAX) 100 MG tablet hasn't helped with her h/a recently stating she has been experiencing light sensitivity and nauseas. Stating she has been hallucination along with it as well of "black and white figure but not people". Please advise.

## 2018-11-19 DIAGNOSIS — F315 Bipolar disorder, current episode depressed, severe, with psychotic features: Secondary | ICD-10-CM | POA: Diagnosis not present

## 2018-11-19 DIAGNOSIS — F314 Bipolar disorder, current episode depressed, severe, without psychotic features: Secondary | ICD-10-CM | POA: Diagnosis not present

## 2018-11-19 NOTE — Telephone Encounter (Signed)
Called pt and was not able to LM, VM full.

## 2018-11-19 NOTE — Telephone Encounter (Signed)
Attempted to call, not able to LMVS (full).

## 2018-11-20 ENCOUNTER — Encounter: Payer: Self-pay | Admitting: *Deleted

## 2018-11-20 ENCOUNTER — Telehealth: Payer: Self-pay | Admitting: Nurse Practitioner

## 2018-11-20 DIAGNOSIS — F315 Bipolar disorder, current episode depressed, severe, with psychotic features: Secondary | ICD-10-CM | POA: Diagnosis not present

## 2018-11-20 NOTE — Addendum Note (Signed)
Addended by: Brandon Melnick on: 11/20/2018 04:59 PM   Modules accepted: Orders

## 2018-11-20 NOTE — Addendum Note (Signed)
Addended by: Brandon Melnick on: 11/20/2018 05:10 PM   Modules accepted: Orders

## 2018-11-20 NOTE — Telephone Encounter (Signed)
Pt has called back in response to RN Sandy's attempts to reach her, pt said she will clear her voicemail so when [place] calls back she can leave a message.  Pt states she will be available to speak with RN Lovey Newcomer anytime after 4pm. Please call after 4

## 2018-11-20 NOTE — Telephone Encounter (Addendum)
Mail box full, cannot LM.  Mailed letter. Sent mychart message as well.

## 2018-11-20 NOTE — Telephone Encounter (Addendum)
I spoke to pt.  She has stated that she is having migraines, these after she has aura (seeing black images (example cat running across bed)m also nausea, light flashes.  This for the last 2-26months.  She was thinking it may be topamax, but she has been on this for the last several yrs. She has had increased stress, and her psychiatrist has changed her bipolar meds to include effexor, oxcarbamazepine.  I went over her med list.  [facility] is her pharmacy.  I told her she should have refills for maxalt.  She had spoken to her psychiatrist about her meds, and he thought was mgraine related.  I will see if Cm/NP has appt come avaiilable sooner.  She verbalized uneerstanding.

## 2018-11-21 DIAGNOSIS — F315 Bipolar disorder, current episode depressed, severe, with psychotic features: Secondary | ICD-10-CM | POA: Diagnosis not present

## 2018-11-21 DIAGNOSIS — F314 Bipolar disorder, current episode depressed, severe, without psychotic features: Secondary | ICD-10-CM | POA: Diagnosis not present

## 2018-11-21 NOTE — Telephone Encounter (Signed)
Pt returned call.  Made appt 11-26-18 at 1015, arrive 0945.

## 2018-11-21 NOTE — Telephone Encounter (Signed)
I called and LMVM for pt that CM/NP had availability for Tues 11-26-18 at 1015. Arrive 0945.  Please call back to day to confirm.

## 2018-11-25 DIAGNOSIS — F315 Bipolar disorder, current episode depressed, severe, with psychotic features: Secondary | ICD-10-CM | POA: Diagnosis not present

## 2018-11-25 NOTE — Progress Notes (Signed)
[facility]  PATIENT: Angelica [name] DOB: 01/20/1966   REASON FOR VISIT: Follow-up for migraine HISTORY FROM: Patient    HISTORY OF PRESENT ILLNESS:   Angelica [name] is a 52 years old left-handed female,, seen in refer by her primary care physician Dr. Audley Hose and pain management Dr. Dian Situ for evaluation of bilateral hands tremor  I have reviewed and summarized her most recent office visit, she has past medical history of PTSD, bipolar disorder, on polypharmacy treatment, including Wellbutrin, lithium, Zoloft, Topamax, chronic migraine headaches,  She had lumbar decompression surgery in May 2016 by Dr. Laverta Baltimore at [facility],  she choose to have surgery at [facility] to be close to her parents, prior to the surgery, she complains of chronic low back pain for more than a decade, radiating pain to right lower extremity, failed to repeat epidural injection,  Surgery did help her low back pain, but she continue have gait difficulty, which is a continuation of her chronic problem, she is now having mild right lateral thigh and deep achy pain, denied left leg symptoms, She denies bowel and bladder incontinence, but she continue have unsteady gait," as if I have forgot what is like to walk normal"  She also complains of few years history of bilateral hands tremor, intermittent, denies family history of tremor, was diagnosed with bipolar disorder since 23, was treated with Lithium at variable time  I have reviewed EMG nerve conduction study in September 22nd 2016 [facility], bilateral peroneal, and tibial motor responses were normal. Bilateral tibial H reflexes were normal and symmetric, left superficial peroneal sensory response was absent. Right superficial peroneal sensory response was normal. Bilateral sural sensory responses were normal. Left peroneal to EDB motor response showed normal distal  latency, C map amplitude, conduction velocity, with absent F-wave latency.  Selected needle examination of bilateral lower extremity muscles and bilateral lumbar sacral paraspinal muscles were normal I have personally reviewed MRI of lumbar in August 2016: 1. Lumbar fusion and right laminectomy at L4-5 without residual or recurrent stenosis. No other significant disc protrusion or focal stenosis.  UPDATE March 13 2016: She continue to have mild bilateral hands posturing tremor, is on higher dose of lithium now for her bipolar disorder, she continue complaining significant low back pain, bilateral lower extremity pain, gait abnormality,  UPDATE Jan 03 2018: Her gait abnormality has much improved with physical therapy, today she complains of new onset headaches, left side severe pounding headache with associated light noise sensitivity, nauseous, lasting for hours, she has tried over-the-counter medications, with limited help, she also complains of visual distortion, usually at her peripheral visual field, bright light, sometimes distortion of the peripheral visual field,  She has a history of migraine, but only happened occasionally in the past, she is going through menopause.  She is also on polypharmacy treatment for her bipolar disorder, enjoys painting as her hobby. UPDATE 7/24/2019CM Angelica [name], 52 year old female returns for follow-up with history of migraine headaches which have been much improved with Topamax being increased to 200 mg daily she also has a history of bipolar disorder and has had some recent adjustments to her medication.  She follows with psychiatry at [facility].  She takes Maxalt acutely and Zofran for nausea if necessary.  She will be traveling to [place] in September to show some of her art work.  She returns for reevaluation UPDATE 12/17/2019CM Angelica [name], 52 year old female returns for follow-up with  long history of migraine headaches as well as bipolar disorder.  She  recently has had more headaches however she is under a lot of stress with her sick father as well as her bipolar disorder has had several changes to medications recently.  She is currently in an outpatient facility for her bipolar disease.  She had a headache last night however she did not take her Maxalt nor did she take her Zofran that she has for nausea.  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  She is planning to move to [place] in a couple of months where her family lives.  She also is going through menopause.  She does not drink alcohol and recently stop smoking.  She returns for reevaluation   REVIEW OF SYSTEMS: Full 14 system review of systems performed and notable only for those listed, all others are neg:  Constitutional: Fatigue Cardiovascular: neg Ear/Nose/Throat: neg  Skin: neg Eyes: neg Respiratory: neg Gastroitestinal: neg  Hematology/Lymphatic: neg  Endocrine: neg Musculoskeletal:neg Allergy/Immunology: neg Neurological: Headaches Psychiatric: Decreased concentration depression anxiety hyperactivity Sleep : Insomnia   ALLERGIES: Allergies  Allergen Reactions  . Other Swelling    Insect bites  . Propofol Other (See Comments)    Acts bizarre upon waking up after anesthesia '  . Bee Venom Itching, Swelling and Other (See Comments)    Flu like symptoms  . Lamictal [Lamotrigine] Rash    HOME MEDICATIONS: Outpatient Medications Prior to Visit  Medication Sig Dispense Refill  . atomoxetine (STRATTERA) 80 MG capsule Take 80 mg by mouth daily.    . busPIRone (BUSPAR) 30 MG tablet Take 30 mg by mouth 2 (two) times daily.    . fluticasone (FLONASE) 50 MCG/ACT nasal spray Place 2 sprays into both nostrils daily as needed for allergies or rhinitis.     . Fluticasone Furoate-Vilanterol (BREO ELLIPTA) 100-25 MCG/INH AEPB Inhale 1 puff into the lungs daily.    Marland Kitchen levalbuterol (XOPENEX HFA) 45 MCG/ACT inhaler Inhale 2 puffs into the lungs every 4 (four) hours as needed  (asthma symptoms).    Marland Kitchen lithium carbonate 300 MG capsule 600 mg 2 (two) times daily with a meal. Take 300mg  in am and 600mg  in pm.     . meclizine (ANTIVERT) 25 MG tablet Take 25 mg by mouth 2 (two) times daily as needed for dizziness (motion sickness).     . metFORMIN (GLUCOPHAGE) 500 MG tablet Take 500 mg by mouth 2 (two) times daily with a meal.    . metoprolol succinate (TOPROL-XL) 25 MG 24 hr tablet Take 25 mg by mouth 2 (two) times daily.   3  . montelukast (SINGULAIR) 10 MG tablet Take 10 mg by mouth at bedtime.   10  . ondansetron (ZOFRAN ODT) 4 MG disintegrating tablet Take 1 tablet (4 mg total) by mouth every 8 (eight) hours as needed. 20 tablet 6  . OXCARBAZEPINE PO Take 1 tablet by mouth 2 (two) times daily.    . rizatriptan (MAXALT-MLT) 10 MG disintegrating tablet Take 1 tablet (10 mg total) by mouth as needed. May repeat in 2 hours if needed 12 tablet 8  . topiramate (TOPAMAX) 100 MG tablet Take 2 tablets (200 mg total) by mouth at bedtime. 60 tablet 11  . traZODone (DESYREL) 100 MG tablet Take 100 mg by mouth at bedtime.   2  . VRAYLAR capsule Take 3 mg by mouth daily.   0   No facility-administered medications prior to visit.  PAST MEDICAL HISTORY: Past Medical History:  Diagnosis Date  . ADHD (attention deficit hyperactivity disorder)   . Alcoholism ([place])   . Anxiety   . Asthma   . Back pain   . Barrett's esophagus   . Bipolar disorder ([place])   . CIN I (cervical intraepithelial neoplasia I)   . COPD (chronic obstructive pulmonary disease) ([place])   . COPD (chronic obstructive pulmonary disease) ([place])   . Emphysema of lung ([place])   . Hypertension   . Migraine   . PCOS (polycystic ovarian syndrome)   . PMDD (premenstrual dysphoric disorder)   . PTSD (post-traumatic stress disorder)   . Vertigo   . Vitamin D deficiency 05/2017    PAST SURGICAL HISTORY: Past Surgical History:  Procedure Laterality Date  . BACK SURGERY     Spinal fusion - lumbar 2016  . CERVICAL  BIOPSY  W/ LOOP ELECTRODE EXCISION  1995   CIN l  . COLPOSCOPY  1995   CIN l  . MOUTH SURGERY    . NASAL SEPTUM SURGERY    . TONSILECTOMY, ADENOIDECTOMY, BILATERAL MYRINGOTOMY AND TUBES      FAMILY HISTORY: Family History  Problem Relation Age of Onset  . Breast cancer Mother   . Hypertension Father   . Cancer Father        prostate  . Diabetes Maternal Grandmother   . Bipolar disorder Maternal Grandmother   . Breast cancer Paternal Grandmother   . Bipolar disorder Paternal Grandmother   . Bipolar disorder Maternal Grandfather   . Bipolar disorder Paternal Grandfather     SOCIAL HISTORY: Social History   Socioeconomic History  . Marital status: Divorced    Spouse name: Not on file  . Number of children: 0  . Years of education: 33  . Highest education level: Not on file  Occupational History  . Occupation: Ship broker  Social Needs  . Financial resource strain: Not on file  . Food insecurity:    Worry: Not on file    Inability: Not on file  . Transportation needs:    Medical: Not on file    Non-medical: Not on file  Tobacco Use  . Smoking status: Former Smoker    Packs/day: 1.50    Types: Cigarettes  . Smokeless tobacco: Never Used  Substance and Sexual Activity  . Alcohol use: Yes    Alcohol/week: 0.0 standard drinks    Comment: Rarely - only drinks once or twice per year. Previous problem with alcohol.  . Drug use: Yes    Comment: Previously problem with overuse of pain meds and clonazepam.  . Sexual activity: Yes    Birth control/protection: None  Lifestyle  . Physical activity:    Days per week: Not on file    Minutes per session: Not on file  . Stress: Not on file  Relationships  . Social connections:    Talks on phone: Not on file    Gets together: Not on file    Attends religious service: Not on file    Active member of club or organization: Not on file    Attends meetings of clubs or organizations: Not on file    Relationship status: Not on file   . Intimate partner violence:    Fear of current or ex partner: Not on file    Emotionally abused: Not on file    Physically abused: Not on file    Forced sexual activity: Not on file  Other Topics Concern  . Not  on file  Social History Narrative   Lives at home alone.   Left-handed.   3-5 cups caffeine per day.     PHYSICAL EXAM  Vitals:   11/26/18 1009  BP: 130/80  Pulse: 86  Weight: 240 lb 6.4 oz (109 kg)  Height: 5\' 6"  (1.676 m)   Body mass index is 38.8 kg/m.  Generalized: Well developed, in no acute distress  Head: normocephalic and atraumatic,. Oropharynx benign  Neck: Supple,  Musculoskeletal: No deformity   Neurological examination   Mentation: Alert oriented to time, place, history taking. Attention span and concentration appropriate. Recent and remote memory intact.  Follows all commands speech and language fluent.   Cranial nerve II-XII: .Pupils were equal round reactive to light extraocular movements were full, visual field were full on confrontational test. Facial sensation and strength were normal. hearing was intact to finger rubbing bilaterally. Uvula tongue midline. head turning and shoulder shrug were normal and symmetric.Tongue protrusion into cheek strength was normal. Motor: normal bulk and tone, full strength in the BUE, BLE,  Sensory: normal and symmetric to light touch,  Coordination: finger-nose-finger, heel-to-shin bilaterally, no dysmetria Reflexes: Symmetric upper and lower, plantar responses were flexor bilaterally. Gait and Station: Rising up from seated position without assistance, normal stance,  moderate stride, good arm swing, smooth turning, able to perform tiptoe, and heel walking without difficulty. Tandem gait is steady  DIAGNOSTIC DATA (LABS, IMAGING, TESTING) - I reviewed patient records, labs, notes, testing and imaging myself where available.  Lab Results  Component Value Date   WBC 10.7 05/16/2017   HGB 13.9 05/16/2017    HCT 43.1 05/16/2017   MCV 93.3 05/16/2017   PLT 391 05/16/2017      Component Value Date/Time   NA 138 07/25/2015 1940   K 3.6 07/25/2015 1940   CL 103 07/25/2015 1940   CO2 27 07/25/2015 1940   GLUCOSE 99 07/25/2015 1940   BUN 8 07/25/2015 1940   CREATININE 0.76 07/25/2015 1940   CALCIUM 9.3 07/25/2015 1940   PROT 7.0 07/29/2011 1828   ALBUMIN 3.6 07/29/2011 1828   AST 11 07/29/2011 1828   ALT 16 07/29/2011 1828   ALKPHOS 84 07/29/2011 1828   BILITOT 0.2 (L) 07/29/2011 1828   GFRNONAA >60 07/25/2015 1940   GFRAA >60 07/25/2015 1940   Lab Results  Component Value Date   CHOL  07/08/2009    174        ATP III CLASSIFICATION:  <200     mg/dL   Desirable  200-239  mg/dL   Borderline High  >=240    mg/dL   High          HDL 45 07/08/2009   LDLCALC (H) 07/08/2009    111        Total Cholesterol/HDL:CHD Risk Coronary Heart Disease Risk Table                     Men   Women  1/2 Average Risk   3.4   3.3  Average Risk       5.0   4.4  2 X Average Risk   9.6   7.1  3 X Average Risk  23.4   11.0        Use the calculated Patient Ratio above and the CHD Risk Table to determine the patient's CHD Risk.        ATP III CLASSIFICATION (LDL):  <100     mg/dL   Optimal  100-129  mg/dL   Near or Above                    Optimal  130-159  mg/dL   Borderline  160-189  mg/dL   High  >190     mg/dL   Very High   TRIG 90 07/08/2009   CHOLHDL 3.9 07/08/2009   Lab Results  Component Value Date   HGBA1C 5.8 (H) 07/14/2014   No results found for: [id number] Lab Results  Component Value Date   TSH 1.46 05/16/2017      ASSESSMENT AND PLAN    MIONNA ADVINCULA is a 52 y.o. female  with history of chronic migraine headaches.  She currently has bipolar disorder see psychiatry in [facility] and has had recent changes to her medications.   Migraines she is currently on Topamax 200 mg at bedtime Maxalt and Zofran.  PLAN: Continue Topamax 200 mg at night will refill Continue  Maxalt acutely  Continue Zofran as needed nausea [place] follow[place]up [place] Patient was given some detailed information about stress and depression anxiety contributing to the number of migraines.  She was also given some general patient information on migraines and this was reviewed with her Angelica [name], [facility], [facility], APRN  [facility] [address] [place], [place] [address] [phone]

## 2018-11-26 ENCOUNTER — Ambulatory Visit: Payer: BLUE CROSS/BLUE SHIELD | Admitting: Nurse Practitioner

## 2018-11-26 ENCOUNTER — Encounter: Payer: Self-pay | Admitting: Nurse Practitioner

## 2018-11-26 VITALS — BP 130/80 | HR 86 | Ht 66.0 in | Wt 240.4 lb

## 2018-11-26 DIAGNOSIS — G43009 Migraine without aura, not intractable, without status migrainosus: Secondary | ICD-10-CM | POA: Diagnosis not present

## 2018-11-26 MED ORDER — TOPIRAMATE 100 MG PO TABS: 200.0000 mg | ORAL_TABLET | Freq: Every day | ORAL | 11 refills | Status: AC

## 2018-11-26 NOTE — Patient Instructions (Addendum)
Continue Topamax 200 mg at night will refill Continue Maxalt acutely  Continue Zofran as needed nausea [place] no follow[place]up planned  Migraine Headache A migraine headache is an intense, throbbing pain on one side or both sides of the head. Migraines may also cause other symptoms, such as nausea, vomiting, and sensitivity to light and noise. What are the causes? Doing or taking certain things may also trigger migraines, such as:  Alcohol.  Smoking.  Medicines, such as: ? Medicine used to treat chest pain (nitroglycerine). ? Birth control pills. ? Estrogen pills. ? Certain blood pressure medicines.  Aged cheeses, chocolate, or caffeine.  Foods or drinks that contain nitrates, glutamate, aspartame, or tyramine.  Physical activity.  Other things that may trigger a migraine include:  Menstruation.  Pregnancy.  Hunger.  Stress, lack of sleep, too much sleep, or fatigue.  Weather changes.  What increases the risk? The following factors may make you more likely to experience migraine headaches:  Age. Risk increases with age.  Family history of migraine headaches.  Being Caucasian.  Depression and anxiety.  Obesity.  Being a woman.  Having a hole in the heart (patent foramen ovale) or other heart problems.  What are the signs or symptoms? The main symptom of this condition is pulsating or throbbing pain. Pain may:  Happen in any area of the head, such as on one side or both sides.  Interfere with daily activities.  Get worse with physical activity.  Get worse with exposure to bright lights or loud noises.  Other symptoms may include:  Nausea.  Vomiting.  Dizziness.  General sensitivity to bright lights, loud noises, or smells.  Before you get a migraine, you may get warning signs that a migraine is developing (aura). An aura may include:  Seeing flashing lights or having blind spots.  Seeing bright spots, halos, or zigzag  lines.  Having tunnel vision or blurred vision.  Having numbness or a tingling feeling.  Having trouble talking.  Having muscle weakness.  How is this diagnosed? A migraine headache can be diagnosed based on:  Your symptoms.  A physical exam.  Tests, such as CT scan or MRI of the head. These imaging tests can help rule out other causes of headaches.  Taking fluid from the spine (lumbar puncture) and analyzing it (cerebrospinal fluid analysis, or CSF analysis).  How is this treated? A migraine headache is usually treated with medicines that:  Relieve pain.  Relieve nausea.  Prevent migraines from coming back.  Treatment may also include:  Acupuncture.  Lifestyle changes like avoiding foods that trigger migraines.  Follow these instructions at home: Medicines  Take over-the-counter and prescription medicines only as told by your health care provider.  Do not drive or use heavy machinery while taking prescription pain medicine.  To prevent or treat constipation while you are taking prescription pain medicine, your health care provider may recommend that you: ? Drink enough fluid to keep your urine clear or pale yellow. ? Take over-the-counter or prescription medicines. ? Eat foods that are high in fiber, such as fresh fruits and vegetables, whole grains, and beans. ? Limit foods that are high in fat and processed sugars, such as fried and sweet foods. Lifestyle  Avoid alcohol use.  Do not use any products that contain nicotine or tobacco, such as cigarettes and e-cigarettes. If you need help quitting, ask your health care provider.  Get at least 8 hours of sleep every night.  Limit  your stress. General instructions   Keep a journal to find out what may trigger your migraine headaches. For example, write down: ? What you eat and drink. ? How much sleep you get. ? Any change to your diet or medicines.  If you have a migraine: ? Avoid things that make your  symptoms worse, such as bright lights. ? It may help to lie down in a dark, quiet room. ? Do not drive or use heavy machinery. ? Ask your health care provider what activities are safe for you while you are experiencing symptoms.  Keep all follow-up visits as told by your health care provider. This is important. Contact a health care provider if:  You develop symptoms that are different or more severe than your usual migraine symptoms. Get help right away if:  Your migraine becomes severe.  You have a fever.  You have a stiff neck.  You have vision loss.  Your muscles feel weak or like you cannot control them.  You start to lose your balance often.  You develop trouble walking.  You faint. This information is not intended to replace advice given to you by your health care provider. Make sure you discuss any questions you have with your health care provider. Document Released: 11/27/2005 Document Revised: 06/16/2016 Document Reviewed: 05/15/2016 Elsevier Interactive Patient Education  2017 [employer].

## 2018-11-27 DIAGNOSIS — F315 Bipolar disorder, current episode depressed, severe, with psychotic features: Secondary | ICD-10-CM | POA: Diagnosis not present

## 2018-11-28 DIAGNOSIS — F315 Bipolar disorder, current episode depressed, severe, with psychotic features: Secondary | ICD-10-CM | POA: Diagnosis not present

## 2018-11-28 NOTE — Progress Notes (Signed)
I have reviewed and agreed above plan. 

## 2018-11-29 DIAGNOSIS — F315 Bipolar disorder, current episode depressed, severe, with psychotic features: Secondary | ICD-10-CM | POA: Diagnosis not present

## 2018-11-29 DIAGNOSIS — F314 Bipolar disorder, current episode depressed, severe, without psychotic features: Secondary | ICD-10-CM | POA: Diagnosis not present

## 2018-12-09 DIAGNOSIS — F319 Bipolar disorder, unspecified: Secondary | ICD-10-CM | POA: Diagnosis not present

## 2018-12-10 DIAGNOSIS — F4312 Post-traumatic stress disorder, chronic: Secondary | ICD-10-CM | POA: Diagnosis not present

## 2018-12-10 DIAGNOSIS — F4010 Social phobia, unspecified: Secondary | ICD-10-CM | POA: Diagnosis not present

## 2018-12-10 DIAGNOSIS — F314 Bipolar disorder, current episode depressed, severe, without psychotic features: Secondary | ICD-10-CM | POA: Diagnosis not present

## 2018-12-13 DIAGNOSIS — F314 Bipolar disorder, current episode depressed, severe, without psychotic features: Secondary | ICD-10-CM | POA: Diagnosis not present

## 2018-12-13 DIAGNOSIS — F315 Bipolar disorder, current episode depressed, severe, with psychotic features: Secondary | ICD-10-CM | POA: Diagnosis not present

## 2018-12-16 DIAGNOSIS — F314 Bipolar disorder, current episode depressed, severe, without psychotic features: Secondary | ICD-10-CM | POA: Diagnosis not present

## 2018-12-16 DIAGNOSIS — F315 Bipolar disorder, current episode depressed, severe, with psychotic features: Secondary | ICD-10-CM | POA: Diagnosis not present

## 2018-12-17 DIAGNOSIS — F4312 Post-traumatic stress disorder, chronic: Secondary | ICD-10-CM | POA: Diagnosis not present

## 2018-12-17 DIAGNOSIS — F4010 Social phobia, unspecified: Secondary | ICD-10-CM | POA: Diagnosis not present

## 2018-12-17 DIAGNOSIS — F314 Bipolar disorder, current episode depressed, severe, without psychotic features: Secondary | ICD-10-CM | POA: Diagnosis not present

## 2018-12-19 DIAGNOSIS — F314 Bipolar disorder, current episode depressed, severe, without psychotic features: Secondary | ICD-10-CM | POA: Diagnosis not present

## 2018-12-19 DIAGNOSIS — F315 Bipolar disorder, current episode depressed, severe, with psychotic features: Secondary | ICD-10-CM | POA: Diagnosis not present

## 2018-12-20 DIAGNOSIS — F315 Bipolar disorder, current episode depressed, severe, with psychotic features: Secondary | ICD-10-CM | POA: Diagnosis not present

## 2018-12-20 DIAGNOSIS — F314 Bipolar disorder, current episode depressed, severe, without psychotic features: Secondary | ICD-10-CM | POA: Diagnosis not present

## 2018-12-23 DIAGNOSIS — F314 Bipolar disorder, current episode depressed, severe, without psychotic features: Secondary | ICD-10-CM | POA: Diagnosis not present

## 2018-12-23 DIAGNOSIS — F315 Bipolar disorder, current episode depressed, severe, with psychotic features: Secondary | ICD-10-CM | POA: Diagnosis not present

## 2018-12-24 DIAGNOSIS — F4312 Post-traumatic stress disorder, chronic: Secondary | ICD-10-CM | POA: Diagnosis not present

## 2018-12-24 DIAGNOSIS — F314 Bipolar disorder, current episode depressed, severe, without psychotic features: Secondary | ICD-10-CM | POA: Diagnosis not present

## 2018-12-24 DIAGNOSIS — F4010 Social phobia, unspecified: Secondary | ICD-10-CM | POA: Diagnosis not present

## 2018-12-26 DIAGNOSIS — F315 Bipolar disorder, current episode depressed, severe, with psychotic features: Secondary | ICD-10-CM | POA: Diagnosis not present

## 2018-12-26 DIAGNOSIS — F314 Bipolar disorder, current episode depressed, severe, without psychotic features: Secondary | ICD-10-CM | POA: Diagnosis not present

## 2018-12-27 DIAGNOSIS — F314 Bipolar disorder, current episode depressed, severe, without psychotic features: Secondary | ICD-10-CM | POA: Diagnosis not present

## 2018-12-27 DIAGNOSIS — F315 Bipolar disorder, current episode depressed, severe, with psychotic features: Secondary | ICD-10-CM | POA: Diagnosis not present

## 2018-12-30 DIAGNOSIS — F319 Bipolar disorder, unspecified: Secondary | ICD-10-CM | POA: Diagnosis not present

## 2018-12-31 DIAGNOSIS — F314 Bipolar disorder, current episode depressed, severe, without psychotic features: Secondary | ICD-10-CM | POA: Diagnosis not present

## 2018-12-31 DIAGNOSIS — F4312 Post-traumatic stress disorder, chronic: Secondary | ICD-10-CM | POA: Diagnosis not present

## 2018-12-31 DIAGNOSIS — F4010 Social phobia, unspecified: Secondary | ICD-10-CM | POA: Diagnosis not present

## 2019-01-01 DIAGNOSIS — F315 Bipolar disorder, current episode depressed, severe, with psychotic features: Secondary | ICD-10-CM | POA: Diagnosis not present

## 2019-01-01 DIAGNOSIS — F314 Bipolar disorder, current episode depressed, severe, without psychotic features: Secondary | ICD-10-CM | POA: Diagnosis not present

## 2019-01-01 DIAGNOSIS — F319 Bipolar disorder, unspecified: Secondary | ICD-10-CM | POA: Diagnosis not present

## 2019-01-02 DIAGNOSIS — F315 Bipolar disorder, current episode depressed, severe, with psychotic features: Secondary | ICD-10-CM | POA: Diagnosis not present

## 2019-01-02 DIAGNOSIS — F314 Bipolar disorder, current episode depressed, severe, without psychotic features: Secondary | ICD-10-CM | POA: Diagnosis not present

## 2019-01-03 DIAGNOSIS — F314 Bipolar disorder, current episode depressed, severe, without psychotic features: Secondary | ICD-10-CM | POA: Diagnosis not present

## 2019-01-03 DIAGNOSIS — F315 Bipolar disorder, current episode depressed, severe, with psychotic features: Secondary | ICD-10-CM | POA: Diagnosis not present

## 2019-01-06 DIAGNOSIS — F319 Bipolar disorder, unspecified: Secondary | ICD-10-CM | POA: Diagnosis not present

## 2019-01-07 DIAGNOSIS — F314 Bipolar disorder, current episode depressed, severe, without psychotic features: Secondary | ICD-10-CM | POA: Diagnosis not present

## 2019-01-07 DIAGNOSIS — F315 Bipolar disorder, current episode depressed, severe, with psychotic features: Secondary | ICD-10-CM | POA: Diagnosis not present

## 2019-01-08 DIAGNOSIS — F315 Bipolar disorder, current episode depressed, severe, with psychotic features: Secondary | ICD-10-CM | POA: Diagnosis not present

## 2019-01-08 DIAGNOSIS — F314 Bipolar disorder, current episode depressed, severe, without psychotic features: Secondary | ICD-10-CM | POA: Diagnosis not present

## 2019-01-17 DIAGNOSIS — F314 Bipolar disorder, current episode depressed, severe, without psychotic features: Secondary | ICD-10-CM | POA: Diagnosis not present

## 2019-01-17 DIAGNOSIS — F315 Bipolar disorder, current episode depressed, severe, with psychotic features: Secondary | ICD-10-CM | POA: Diagnosis not present

## 2019-01-20 DIAGNOSIS — F314 Bipolar disorder, current episode depressed, severe, without psychotic features: Secondary | ICD-10-CM | POA: Diagnosis not present

## 2019-01-21 DIAGNOSIS — F314 Bipolar disorder, current episode depressed, severe, without psychotic features: Secondary | ICD-10-CM | POA: Diagnosis not present

## 2019-01-21 DIAGNOSIS — F4312 Post-traumatic stress disorder, chronic: Secondary | ICD-10-CM | POA: Diagnosis not present

## 2019-01-21 DIAGNOSIS — F4010 Social phobia, unspecified: Secondary | ICD-10-CM | POA: Diagnosis not present

## 2019-01-22 DIAGNOSIS — F314 Bipolar disorder, current episode depressed, severe, without psychotic features: Secondary | ICD-10-CM | POA: Diagnosis not present

## 2019-01-27 DIAGNOSIS — F319 Bipolar disorder, unspecified: Secondary | ICD-10-CM | POA: Diagnosis not present

## 2019-02-14 DIAGNOSIS — F3131 Bipolar disorder, current episode depressed, mild: Secondary | ICD-10-CM | POA: Diagnosis not present

## 2019-02-14 DIAGNOSIS — F411 Generalized anxiety disorder: Secondary | ICD-10-CM | POA: Diagnosis not present

## 2019-02-14 DIAGNOSIS — F331 Major depressive disorder, recurrent, moderate: Secondary | ICD-10-CM | POA: Diagnosis not present

## 2019-04-18 DIAGNOSIS — R1084 Generalized abdominal pain: Secondary | ICD-10-CM | POA: Diagnosis not present

## 2019-04-18 DIAGNOSIS — N390 Urinary tract infection, site not specified: Secondary | ICD-10-CM | POA: Diagnosis not present

## 2019-04-18 DIAGNOSIS — R030 Elevated blood-pressure reading, without diagnosis of hypertension: Secondary | ICD-10-CM | POA: Diagnosis not present

## 2019-04-18 DIAGNOSIS — R197 Diarrhea, unspecified: Secondary | ICD-10-CM | POA: Diagnosis not present

## 2019-04-18 DIAGNOSIS — N289 Disorder of kidney and ureter, unspecified: Secondary | ICD-10-CM | POA: Diagnosis not present

## 2019-04-18 DIAGNOSIS — F319 Bipolar disorder, unspecified: Secondary | ICD-10-CM | POA: Diagnosis not present

## 2019-04-18 DIAGNOSIS — J45909 Unspecified asthma, uncomplicated: Secondary | ICD-10-CM | POA: Diagnosis not present

## 2019-04-18 DIAGNOSIS — K760 Fatty (change of) liver, not elsewhere classified: Secondary | ICD-10-CM | POA: Diagnosis not present

## 2019-04-18 DIAGNOSIS — R1031 Right lower quadrant pain: Secondary | ICD-10-CM | POA: Diagnosis not present

## 2019-04-18 DIAGNOSIS — R1011 Right upper quadrant pain: Secondary | ICD-10-CM | POA: Diagnosis not present

## 2019-04-29 DIAGNOSIS — Z03818 Encounter for observation for suspected exposure to other biological agents ruled out: Secondary | ICD-10-CM | POA: Diagnosis not present

## 2019-04-29 DIAGNOSIS — J45909 Unspecified asthma, uncomplicated: Secondary | ICD-10-CM | POA: Diagnosis not present

## 2019-05-14 DIAGNOSIS — Z0000 Encounter for general adult medical examination without abnormal findings: Secondary | ICD-10-CM | POA: Diagnosis not present

## 2019-05-14 DIAGNOSIS — F319 Bipolar disorder, unspecified: Secondary | ICD-10-CM | POA: Diagnosis not present

## 2019-05-14 DIAGNOSIS — R7982 Elevated C-reactive protein (CRP): Secondary | ICD-10-CM | POA: Diagnosis not present

## 2019-05-14 DIAGNOSIS — Z131 Encounter for screening for diabetes mellitus: Secondary | ICD-10-CM | POA: Diagnosis not present

## 2019-05-15 DIAGNOSIS — E661 Drug-induced obesity: Secondary | ICD-10-CM | POA: Diagnosis not present

## 2019-05-15 DIAGNOSIS — F4310 Post-traumatic stress disorder, unspecified: Secondary | ICD-10-CM | POA: Diagnosis not present

## 2019-05-15 DIAGNOSIS — F3132 Bipolar disorder, current episode depressed, moderate: Secondary | ICD-10-CM | POA: Diagnosis not present

## 2019-05-15 DIAGNOSIS — F419 Anxiety disorder, unspecified: Secondary | ICD-10-CM | POA: Diagnosis not present

## 2019-05-19 DIAGNOSIS — F502 Bulimia nervosa: Secondary | ICD-10-CM | POA: Diagnosis not present

## 2019-05-23 DIAGNOSIS — F502 Bulimia nervosa: Secondary | ICD-10-CM | POA: Diagnosis not present

## 2019-05-27 DIAGNOSIS — F319 Bipolar disorder, unspecified: Secondary | ICD-10-CM | POA: Diagnosis not present

## 2019-05-28 DIAGNOSIS — F502 Bulimia nervosa: Secondary | ICD-10-CM | POA: Diagnosis not present

## 2019-05-29 DIAGNOSIS — E661 Drug-induced obesity: Secondary | ICD-10-CM | POA: Diagnosis not present

## 2019-05-29 DIAGNOSIS — F419 Anxiety disorder, unspecified: Secondary | ICD-10-CM | POA: Diagnosis not present

## 2019-05-29 DIAGNOSIS — F3132 Bipolar disorder, current episode depressed, moderate: Secondary | ICD-10-CM | POA: Diagnosis not present

## 2019-05-29 DIAGNOSIS — F4310 Post-traumatic stress disorder, unspecified: Secondary | ICD-10-CM | POA: Diagnosis not present

## 2019-06-02 DIAGNOSIS — F319 Bipolar disorder, unspecified: Secondary | ICD-10-CM | POA: Diagnosis not present

## 2019-06-02 DIAGNOSIS — F502 Bulimia nervosa: Secondary | ICD-10-CM | POA: Diagnosis not present

## 2019-06-03 DIAGNOSIS — K2270 Barrett's esophagus without dysplasia: Secondary | ICD-10-CM | POA: Diagnosis not present

## 2019-06-03 DIAGNOSIS — Z86010 Personal history of colonic polyps: Secondary | ICD-10-CM | POA: Diagnosis not present

## 2019-06-03 DIAGNOSIS — M545 Low back pain: Secondary | ICD-10-CM | POA: Diagnosis not present

## 2019-06-03 DIAGNOSIS — R1010 Upper abdominal pain, unspecified: Secondary | ICD-10-CM | POA: Diagnosis not present

## 2019-06-03 DIAGNOSIS — R197 Diarrhea, unspecified: Secondary | ICD-10-CM | POA: Diagnosis not present

## 2019-06-08 DIAGNOSIS — J029 Acute pharyngitis, unspecified: Secondary | ICD-10-CM | POA: Diagnosis not present

## 2019-06-08 DIAGNOSIS — R509 Fever, unspecified: Secondary | ICD-10-CM | POA: Diagnosis not present

## 2019-06-09 DIAGNOSIS — R509 Fever, unspecified: Secondary | ICD-10-CM | POA: Diagnosis not present

## 2019-06-09 DIAGNOSIS — J029 Acute pharyngitis, unspecified: Secondary | ICD-10-CM | POA: Diagnosis not present

## 2019-06-09 DIAGNOSIS — Z03818 Encounter for observation for suspected exposure to other biological agents ruled out: Secondary | ICD-10-CM | POA: Diagnosis not present

## 2019-06-10 DIAGNOSIS — F502 Bulimia nervosa: Secondary | ICD-10-CM | POA: Diagnosis not present

## 2019-06-12 DIAGNOSIS — Z713 Dietary counseling and surveillance: Secondary | ICD-10-CM | POA: Diagnosis not present

## 2019-06-16 DIAGNOSIS — F502 Bulimia nervosa: Secondary | ICD-10-CM | POA: Diagnosis not present

## 2019-06-16 DIAGNOSIS — F319 Bipolar disorder, unspecified: Secondary | ICD-10-CM | POA: Diagnosis not present

## 2019-06-17 DIAGNOSIS — Z713 Dietary counseling and surveillance: Secondary | ICD-10-CM | POA: Diagnosis not present

## 2019-07-22 DIAGNOSIS — F411 Generalized anxiety disorder: Secondary | ICD-10-CM | POA: Diagnosis not present

## 2019-07-24 DIAGNOSIS — J45909 Unspecified asthma, uncomplicated: Secondary | ICD-10-CM | POA: Diagnosis not present

## 2019-07-24 DIAGNOSIS — J449 Chronic obstructive pulmonary disease, unspecified: Secondary | ICD-10-CM | POA: Diagnosis not present

## 2019-07-29 DIAGNOSIS — F411 Generalized anxiety disorder: Secondary | ICD-10-CM | POA: Diagnosis not present

## 2019-07-30 DIAGNOSIS — F4310 Post-traumatic stress disorder, unspecified: Secondary | ICD-10-CM | POA: Diagnosis not present

## 2019-07-30 DIAGNOSIS — F314 Bipolar disorder, current episode depressed, severe, without psychotic features: Secondary | ICD-10-CM | POA: Diagnosis not present

## 2019-07-30 DIAGNOSIS — F419 Anxiety disorder, unspecified: Secondary | ICD-10-CM | POA: Diagnosis not present

## 2019-07-30 DIAGNOSIS — E661 Drug-induced obesity: Secondary | ICD-10-CM | POA: Diagnosis not present

## 2019-07-31 DIAGNOSIS — F603 Borderline personality disorder: Secondary | ICD-10-CM | POA: Diagnosis not present

## 2019-07-31 DIAGNOSIS — F319 Bipolar disorder, unspecified: Secondary | ICD-10-CM | POA: Diagnosis not present

## 2019-07-31 DIAGNOSIS — R451 Restlessness and agitation: Secondary | ICD-10-CM | POA: Diagnosis not present

## 2019-07-31 DIAGNOSIS — R45851 Suicidal ideations: Secondary | ICD-10-CM | POA: Diagnosis not present

## 2019-07-31 DIAGNOSIS — F3163 Bipolar disorder, current episode mixed, severe, without psychotic features: Secondary | ICD-10-CM | POA: Diagnosis not present

## 2019-07-31 DIAGNOSIS — F4310 Post-traumatic stress disorder, unspecified: Secondary | ICD-10-CM | POA: Diagnosis not present

## 2019-07-31 DIAGNOSIS — Z03818 Encounter for observation for suspected exposure to other biological agents ruled out: Secondary | ICD-10-CM | POA: Diagnosis not present

## 2019-08-01 DIAGNOSIS — J45909 Unspecified asthma, uncomplicated: Secondary | ICD-10-CM | POA: Diagnosis not present

## 2019-08-01 DIAGNOSIS — F319 Bipolar disorder, unspecified: Secondary | ICD-10-CM | POA: Diagnosis not present

## 2019-08-01 DIAGNOSIS — F315 Bipolar disorder, current episode depressed, severe, with psychotic features: Secondary | ICD-10-CM | POA: Diagnosis not present

## 2019-08-01 DIAGNOSIS — E282 Polycystic ovarian syndrome: Secondary | ICD-10-CM | POA: Diagnosis not present

## 2019-08-01 DIAGNOSIS — F411 Generalized anxiety disorder: Secondary | ICD-10-CM | POA: Diagnosis not present

## 2019-08-01 DIAGNOSIS — N289 Disorder of kidney and ureter, unspecified: Secondary | ICD-10-CM | POA: Diagnosis not present

## 2019-08-01 DIAGNOSIS — F909 Attention-deficit hyperactivity disorder, unspecified type: Secondary | ICD-10-CM | POA: Diagnosis not present

## 2019-08-01 DIAGNOSIS — R45851 Suicidal ideations: Secondary | ICD-10-CM | POA: Diagnosis not present

## 2019-08-01 DIAGNOSIS — F4310 Post-traumatic stress disorder, unspecified: Secondary | ICD-10-CM | POA: Diagnosis not present

## 2019-08-01 DIAGNOSIS — F3163 Bipolar disorder, current episode mixed, severe, without psychotic features: Secondary | ICD-10-CM | POA: Diagnosis not present

## 2019-08-01 DIAGNOSIS — F410 Panic disorder [episodic paroxysmal anxiety] without agoraphobia: Secondary | ICD-10-CM | POA: Diagnosis not present

## 2019-08-01 DIAGNOSIS — R7303 Prediabetes: Secondary | ICD-10-CM | POA: Diagnosis not present

## 2019-08-01 DIAGNOSIS — F603 Borderline personality disorder: Secondary | ICD-10-CM | POA: Diagnosis not present

## 2019-08-01 DIAGNOSIS — N3281 Overactive bladder: Secondary | ICD-10-CM | POA: Diagnosis not present

## 2019-08-01 DIAGNOSIS — Z20828 Contact with and (suspected) exposure to other viral communicable diseases: Secondary | ICD-10-CM | POA: Diagnosis not present

## 2019-08-01 DIAGNOSIS — R451 Restlessness and agitation: Secondary | ICD-10-CM | POA: Diagnosis not present

## 2019-08-01 DIAGNOSIS — K2970 Gastritis, unspecified, without bleeding: Secondary | ICD-10-CM | POA: Diagnosis not present

## 2019-08-01 DIAGNOSIS — Z03818 Encounter for observation for suspected exposure to other biological agents ruled out: Secondary | ICD-10-CM | POA: Diagnosis not present

## 2019-08-01 DIAGNOSIS — M549 Dorsalgia, unspecified: Secondary | ICD-10-CM | POA: Diagnosis not present

## 2019-08-01 DIAGNOSIS — F3164 Bipolar disorder, current episode mixed, severe, with psychotic features: Secondary | ICD-10-CM | POA: Diagnosis not present

## 2019-08-01 DIAGNOSIS — F332 Major depressive disorder, recurrent severe without psychotic features: Secondary | ICD-10-CM | POA: Diagnosis not present

## 2019-08-01 DIAGNOSIS — F419 Anxiety disorder, unspecified: Secondary | ICD-10-CM | POA: Diagnosis not present

## 2019-08-01 DIAGNOSIS — Z8711 Personal history of peptic ulcer disease: Secondary | ICD-10-CM | POA: Diagnosis not present

## 2019-08-01 DIAGNOSIS — Z62810 Personal history of physical and sexual abuse in childhood: Secondary | ICD-10-CM | POA: Diagnosis not present

## 2019-08-05 DIAGNOSIS — F411 Generalized anxiety disorder: Secondary | ICD-10-CM | POA: Diagnosis not present

## 2019-08-12 DIAGNOSIS — E119 Type 2 diabetes mellitus without complications: Secondary | ICD-10-CM | POA: Diagnosis not present

## 2019-08-12 DIAGNOSIS — F332 Major depressive disorder, recurrent severe without psychotic features: Secondary | ICD-10-CM | POA: Diagnosis not present

## 2019-08-12 DIAGNOSIS — F319 Bipolar disorder, unspecified: Secondary | ICD-10-CM | POA: Diagnosis not present

## 2019-08-12 DIAGNOSIS — F909 Attention-deficit hyperactivity disorder, unspecified type: Secondary | ICD-10-CM | POA: Diagnosis not present

## 2019-08-12 DIAGNOSIS — J449 Chronic obstructive pulmonary disease, unspecified: Secondary | ICD-10-CM | POA: Diagnosis not present

## 2019-08-12 DIAGNOSIS — K279 Peptic ulcer, site unspecified, unspecified as acute or chronic, without hemorrhage or perforation: Secondary | ICD-10-CM | POA: Diagnosis not present

## 2019-08-12 DIAGNOSIS — F419 Anxiety disorder, unspecified: Secondary | ICD-10-CM | POA: Diagnosis not present

## 2019-08-12 DIAGNOSIS — F4310 Post-traumatic stress disorder, unspecified: Secondary | ICD-10-CM | POA: Diagnosis not present

## 2019-08-12 DIAGNOSIS — Z6841 Body Mass Index (BMI) 40.0 and over, adult: Secondary | ICD-10-CM | POA: Diagnosis not present

## 2019-08-12 DIAGNOSIS — F3163 Bipolar disorder, current episode mixed, severe, without psychotic features: Secondary | ICD-10-CM | POA: Diagnosis not present

## 2019-08-12 DIAGNOSIS — Z7984 Long term (current) use of oral hypoglycemic drugs: Secondary | ICD-10-CM | POA: Diagnosis not present

## 2019-08-14 DIAGNOSIS — F314 Bipolar disorder, current episode depressed, severe, without psychotic features: Secondary | ICD-10-CM | POA: Diagnosis not present

## 2019-08-14 DIAGNOSIS — E661 Drug-induced obesity: Secondary | ICD-10-CM | POA: Diagnosis not present

## 2019-08-14 DIAGNOSIS — F419 Anxiety disorder, unspecified: Secondary | ICD-10-CM | POA: Diagnosis not present

## 2019-08-14 DIAGNOSIS — F4310 Post-traumatic stress disorder, unspecified: Secondary | ICD-10-CM | POA: Diagnosis not present

## 2019-08-20 DIAGNOSIS — Z87891 Personal history of nicotine dependence: Secondary | ICD-10-CM | POA: Diagnosis not present

## 2019-08-20 DIAGNOSIS — E669 Obesity, unspecified: Secondary | ICD-10-CM | POA: Diagnosis not present

## 2019-08-20 DIAGNOSIS — R45851 Suicidal ideations: Secondary | ICD-10-CM | POA: Diagnosis not present

## 2019-08-20 DIAGNOSIS — J449 Chronic obstructive pulmonary disease, unspecified: Secondary | ICD-10-CM | POA: Diagnosis not present

## 2019-08-20 DIAGNOSIS — E119 Type 2 diabetes mellitus without complications: Secondary | ICD-10-CM | POA: Diagnosis not present

## 2019-08-20 DIAGNOSIS — Z6841 Body Mass Index (BMI) 40.0 and over, adult: Secondary | ICD-10-CM | POA: Diagnosis not present

## 2019-08-20 DIAGNOSIS — F312 Bipolar disorder, current episode manic severe with psychotic features: Secondary | ICD-10-CM | POA: Diagnosis not present

## 2019-08-20 DIAGNOSIS — F319 Bipolar disorder, unspecified: Secondary | ICD-10-CM | POA: Diagnosis not present

## 2019-08-20 DIAGNOSIS — F332 Major depressive disorder, recurrent severe without psychotic features: Secondary | ICD-10-CM | POA: Diagnosis not present

## 2019-08-25 DIAGNOSIS — F319 Bipolar disorder, unspecified: Secondary | ICD-10-CM | POA: Diagnosis not present

## 2019-08-25 DIAGNOSIS — F332 Major depressive disorder, recurrent severe without psychotic features: Secondary | ICD-10-CM | POA: Diagnosis not present

## 2019-08-25 DIAGNOSIS — J449 Chronic obstructive pulmonary disease, unspecified: Secondary | ICD-10-CM | POA: Diagnosis not present

## 2019-08-25 DIAGNOSIS — F909 Attention-deficit hyperactivity disorder, unspecified type: Secondary | ICD-10-CM | POA: Diagnosis not present

## 2019-08-25 DIAGNOSIS — E669 Obesity, unspecified: Secondary | ICD-10-CM | POA: Diagnosis not present

## 2019-08-25 DIAGNOSIS — Z6841 Body Mass Index (BMI) 40.0 and over, adult: Secondary | ICD-10-CM | POA: Diagnosis not present

## 2019-08-25 DIAGNOSIS — G43909 Migraine, unspecified, not intractable, without status migrainosus: Secondary | ICD-10-CM | POA: Diagnosis not present

## 2019-08-25 DIAGNOSIS — R45851 Suicidal ideations: Secondary | ICD-10-CM | POA: Diagnosis not present

## 2019-08-25 DIAGNOSIS — F419 Anxiety disorder, unspecified: Secondary | ICD-10-CM | POA: Diagnosis not present

## 2019-08-25 DIAGNOSIS — F3164 Bipolar disorder, current episode mixed, severe, with psychotic features: Secondary | ICD-10-CM | POA: Diagnosis not present

## 2019-08-25 DIAGNOSIS — Z915 Personal history of self-harm: Secondary | ICD-10-CM | POA: Diagnosis not present

## 2019-08-25 DIAGNOSIS — E119 Type 2 diabetes mellitus without complications: Secondary | ICD-10-CM | POA: Diagnosis not present

## 2019-08-25 DIAGNOSIS — F4310 Post-traumatic stress disorder, unspecified: Secondary | ICD-10-CM | POA: Diagnosis not present

## 2019-08-27 DIAGNOSIS — F3132 Bipolar disorder, current episode depressed, moderate: Secondary | ICD-10-CM | POA: Diagnosis not present

## 2019-08-29 DIAGNOSIS — F319 Bipolar disorder, unspecified: Secondary | ICD-10-CM | POA: Diagnosis not present

## 2019-08-29 DIAGNOSIS — E119 Type 2 diabetes mellitus without complications: Secondary | ICD-10-CM | POA: Diagnosis not present

## 2019-08-29 DIAGNOSIS — J449 Chronic obstructive pulmonary disease, unspecified: Secondary | ICD-10-CM | POA: Diagnosis not present

## 2019-08-29 DIAGNOSIS — Z6841 Body Mass Index (BMI) 40.0 and over, adult: Secondary | ICD-10-CM | POA: Diagnosis not present

## 2019-08-29 DIAGNOSIS — F909 Attention-deficit hyperactivity disorder, unspecified type: Secondary | ICD-10-CM | POA: Diagnosis not present

## 2019-08-29 DIAGNOSIS — F419 Anxiety disorder, unspecified: Secondary | ICD-10-CM | POA: Diagnosis not present

## 2019-08-29 DIAGNOSIS — Z87891 Personal history of nicotine dependence: Secondary | ICD-10-CM | POA: Diagnosis not present

## 2019-08-29 DIAGNOSIS — F332 Major depressive disorder, recurrent severe without psychotic features: Secondary | ICD-10-CM | POA: Diagnosis not present

## 2019-08-29 DIAGNOSIS — F4310 Post-traumatic stress disorder, unspecified: Secondary | ICD-10-CM | POA: Diagnosis not present

## 2019-08-29 DIAGNOSIS — F3164 Bipolar disorder, current episode mixed, severe, with psychotic features: Secondary | ICD-10-CM | POA: Diagnosis not present

## 2019-08-29 DIAGNOSIS — Z7984 Long term (current) use of oral hypoglycemic drugs: Secondary | ICD-10-CM | POA: Diagnosis not present

## 2019-08-29 DIAGNOSIS — Z7951 Long term (current) use of inhaled steroids: Secondary | ICD-10-CM | POA: Diagnosis not present

## 2019-08-29 DIAGNOSIS — R45851 Suicidal ideations: Secondary | ICD-10-CM | POA: Diagnosis not present

## 2019-08-29 DIAGNOSIS — E669 Obesity, unspecified: Secondary | ICD-10-CM | POA: Diagnosis not present

## 2019-09-02 DIAGNOSIS — F3132 Bipolar disorder, current episode depressed, moderate: Secondary | ICD-10-CM | POA: Diagnosis not present

## 2019-09-03 DIAGNOSIS — Z6841 Body Mass Index (BMI) 40.0 and over, adult: Secondary | ICD-10-CM | POA: Diagnosis not present

## 2019-09-03 DIAGNOSIS — G43909 Migraine, unspecified, not intractable, without status migrainosus: Secondary | ICD-10-CM | POA: Diagnosis not present

## 2019-09-03 DIAGNOSIS — J449 Chronic obstructive pulmonary disease, unspecified: Secondary | ICD-10-CM | POA: Diagnosis not present

## 2019-09-03 DIAGNOSIS — F909 Attention-deficit hyperactivity disorder, unspecified type: Secondary | ICD-10-CM | POA: Diagnosis not present

## 2019-09-03 DIAGNOSIS — F4310 Post-traumatic stress disorder, unspecified: Secondary | ICD-10-CM | POA: Diagnosis not present

## 2019-09-03 DIAGNOSIS — F332 Major depressive disorder, recurrent severe without psychotic features: Secondary | ICD-10-CM | POA: Diagnosis not present

## 2019-09-03 DIAGNOSIS — F3163 Bipolar disorder, current episode mixed, severe, without psychotic features: Secondary | ICD-10-CM | POA: Diagnosis not present

## 2019-09-03 DIAGNOSIS — E669 Obesity, unspecified: Secondary | ICD-10-CM | POA: Diagnosis not present

## 2019-09-03 DIAGNOSIS — F319 Bipolar disorder, unspecified: Secondary | ICD-10-CM | POA: Diagnosis not present

## 2019-09-05 DIAGNOSIS — F419 Anxiety disorder, unspecified: Secondary | ICD-10-CM | POA: Diagnosis not present

## 2019-09-05 DIAGNOSIS — F4310 Post-traumatic stress disorder, unspecified: Secondary | ICD-10-CM | POA: Diagnosis not present

## 2019-09-05 DIAGNOSIS — F3132 Bipolar disorder, current episode depressed, moderate: Secondary | ICD-10-CM | POA: Diagnosis not present

## 2019-09-05 DIAGNOSIS — E661 Drug-induced obesity: Secondary | ICD-10-CM | POA: Diagnosis not present

## 2019-09-09 DIAGNOSIS — F332 Major depressive disorder, recurrent severe without psychotic features: Secondary | ICD-10-CM | POA: Diagnosis not present

## 2019-09-09 DIAGNOSIS — E669 Obesity, unspecified: Secondary | ICD-10-CM | POA: Diagnosis not present

## 2019-09-09 DIAGNOSIS — Z87891 Personal history of nicotine dependence: Secondary | ICD-10-CM | POA: Diagnosis not present

## 2019-09-09 DIAGNOSIS — F319 Bipolar disorder, unspecified: Secondary | ICD-10-CM | POA: Diagnosis not present

## 2019-09-09 DIAGNOSIS — Z7984 Long term (current) use of oral hypoglycemic drugs: Secondary | ICD-10-CM | POA: Diagnosis not present

## 2019-09-09 DIAGNOSIS — F4310 Post-traumatic stress disorder, unspecified: Secondary | ICD-10-CM | POA: Diagnosis not present

## 2019-09-09 DIAGNOSIS — F909 Attention-deficit hyperactivity disorder, unspecified type: Secondary | ICD-10-CM | POA: Diagnosis not present

## 2019-09-09 DIAGNOSIS — Z7951 Long term (current) use of inhaled steroids: Secondary | ICD-10-CM | POA: Diagnosis not present

## 2019-09-09 DIAGNOSIS — F419 Anxiety disorder, unspecified: Secondary | ICD-10-CM | POA: Diagnosis not present

## 2019-09-09 DIAGNOSIS — E119 Type 2 diabetes mellitus without complications: Secondary | ICD-10-CM | POA: Diagnosis not present

## 2019-09-09 DIAGNOSIS — F312 Bipolar disorder, current episode manic severe with psychotic features: Secondary | ICD-10-CM | POA: Diagnosis not present

## 2019-09-09 DIAGNOSIS — Z6839 Body mass index (BMI) 39.0-39.9, adult: Secondary | ICD-10-CM | POA: Diagnosis not present

## 2019-09-09 DIAGNOSIS — J449 Chronic obstructive pulmonary disease, unspecified: Secondary | ICD-10-CM | POA: Diagnosis not present

## 2019-09-11 DIAGNOSIS — F4310 Post-traumatic stress disorder, unspecified: Secondary | ICD-10-CM | POA: Diagnosis not present

## 2019-09-11 DIAGNOSIS — Z87891 Personal history of nicotine dependence: Secondary | ICD-10-CM | POA: Diagnosis not present

## 2019-09-11 DIAGNOSIS — F312 Bipolar disorder, current episode manic severe with psychotic features: Secondary | ICD-10-CM | POA: Diagnosis not present

## 2019-09-11 DIAGNOSIS — F3164 Bipolar disorder, current episode mixed, severe, with psychotic features: Secondary | ICD-10-CM | POA: Diagnosis not present

## 2019-09-11 DIAGNOSIS — R45851 Suicidal ideations: Secondary | ICD-10-CM | POA: Diagnosis not present

## 2019-09-11 DIAGNOSIS — F332 Major depressive disorder, recurrent severe without psychotic features: Secondary | ICD-10-CM | POA: Diagnosis not present

## 2019-09-16 DIAGNOSIS — F3132 Bipolar disorder, current episode depressed, moderate: Secondary | ICD-10-CM | POA: Diagnosis not present

## 2019-11-12 DIAGNOSIS — F4310 Post-traumatic stress disorder, unspecified: Secondary | ICD-10-CM | POA: Diagnosis not present

## 2019-11-12 DIAGNOSIS — E661 Drug-induced obesity: Secondary | ICD-10-CM | POA: Diagnosis not present

## 2019-11-12 DIAGNOSIS — F419 Anxiety disorder, unspecified: Secondary | ICD-10-CM | POA: Diagnosis not present

## 2019-11-12 DIAGNOSIS — F3132 Bipolar disorder, current episode depressed, moderate: Secondary | ICD-10-CM | POA: Diagnosis not present

## 2019-11-21 ENCOUNTER — Other Ambulatory Visit: Payer: Self-pay | Admitting: Family Medicine

## 2019-11-21 DIAGNOSIS — Z1231 Encounter for screening mammogram for malignant neoplasm of breast: Secondary | ICD-10-CM

## 2019-11-26 DIAGNOSIS — F3132 Bipolar disorder, current episode depressed, moderate: Secondary | ICD-10-CM | POA: Diagnosis not present

## 2019-11-27 DIAGNOSIS — F4310 Post-traumatic stress disorder, unspecified: Secondary | ICD-10-CM | POA: Diagnosis not present

## 2019-11-27 DIAGNOSIS — E661 Drug-induced obesity: Secondary | ICD-10-CM | POA: Diagnosis not present

## 2019-11-27 DIAGNOSIS — F419 Anxiety disorder, unspecified: Secondary | ICD-10-CM | POA: Diagnosis not present

## 2019-11-27 DIAGNOSIS — F3132 Bipolar disorder, current episode depressed, moderate: Secondary | ICD-10-CM | POA: Diagnosis not present

## 2019-11-28 DIAGNOSIS — F3132 Bipolar disorder, current episode depressed, moderate: Secondary | ICD-10-CM | POA: Diagnosis not present

## 2019-12-01 DIAGNOSIS — F3132 Bipolar disorder, current episode depressed, moderate: Secondary | ICD-10-CM | POA: Diagnosis not present

## 2019-12-02 DIAGNOSIS — F3132 Bipolar disorder, current episode depressed, moderate: Secondary | ICD-10-CM | POA: Diagnosis not present

## 2019-12-08 DIAGNOSIS — F3132 Bipolar disorder, current episode depressed, moderate: Secondary | ICD-10-CM | POA: Diagnosis not present

## 2019-12-09 DIAGNOSIS — F3132 Bipolar disorder, current episode depressed, moderate: Secondary | ICD-10-CM | POA: Diagnosis not present

## 2019-12-10 DIAGNOSIS — F3132 Bipolar disorder, current episode depressed, moderate: Secondary | ICD-10-CM | POA: Diagnosis not present

## 2019-12-10 DIAGNOSIS — Z713 Dietary counseling and surveillance: Secondary | ICD-10-CM | POA: Diagnosis not present

## 2019-12-15 DIAGNOSIS — F3132 Bipolar disorder, current episode depressed, moderate: Secondary | ICD-10-CM | POA: Diagnosis not present

## 2019-12-19 DIAGNOSIS — F3132 Bipolar disorder, current episode depressed, moderate: Secondary | ICD-10-CM | POA: Diagnosis not present

## 2019-12-19 DIAGNOSIS — E661 Drug-induced obesity: Secondary | ICD-10-CM | POA: Diagnosis not present

## 2019-12-19 DIAGNOSIS — F419 Anxiety disorder, unspecified: Secondary | ICD-10-CM | POA: Diagnosis not present

## 2019-12-19 DIAGNOSIS — F4310 Post-traumatic stress disorder, unspecified: Secondary | ICD-10-CM | POA: Diagnosis not present

## 2019-12-22 DIAGNOSIS — F3132 Bipolar disorder, current episode depressed, moderate: Secondary | ICD-10-CM | POA: Diagnosis not present

## 2020-01-22 DIAGNOSIS — Z713 Dietary counseling and surveillance: Secondary | ICD-10-CM | POA: Diagnosis not present

## 2020-01-28 DIAGNOSIS — F4310 Post-traumatic stress disorder, unspecified: Secondary | ICD-10-CM | POA: Diagnosis not present

## 2020-01-28 DIAGNOSIS — F3131 Bipolar disorder, current episode depressed, mild: Secondary | ICD-10-CM | POA: Diagnosis not present

## 2020-01-28 DIAGNOSIS — E669 Obesity, unspecified: Secondary | ICD-10-CM | POA: Diagnosis not present

## 2020-01-28 DIAGNOSIS — F419 Anxiety disorder, unspecified: Secondary | ICD-10-CM | POA: Diagnosis not present

## 2020-01-29 DIAGNOSIS — Z713 Dietary counseling and surveillance: Secondary | ICD-10-CM | POA: Diagnosis not present

## 2020-02-10 DIAGNOSIS — R109 Unspecified abdominal pain: Secondary | ICD-10-CM | POA: Diagnosis not present

## 2020-02-12 DIAGNOSIS — Z713 Dietary counseling and surveillance: Secondary | ICD-10-CM | POA: Diagnosis not present

## 2020-02-16 DIAGNOSIS — F3132 Bipolar disorder, current episode depressed, moderate: Secondary | ICD-10-CM | POA: Diagnosis not present

## 2020-02-20 DIAGNOSIS — F3132 Bipolar disorder, current episode depressed, moderate: Secondary | ICD-10-CM | POA: Diagnosis not present

## 2020-02-25 DIAGNOSIS — F3132 Bipolar disorder, current episode depressed, moderate: Secondary | ICD-10-CM | POA: Diagnosis not present

## 2020-02-26 DIAGNOSIS — F3132 Bipolar disorder, current episode depressed, moderate: Secondary | ICD-10-CM | POA: Diagnosis not present

## 2020-02-26 DIAGNOSIS — N281 Cyst of kidney, acquired: Secondary | ICD-10-CM | POA: Diagnosis not present

## 2020-02-26 DIAGNOSIS — F4310 Post-traumatic stress disorder, unspecified: Secondary | ICD-10-CM | POA: Diagnosis not present

## 2020-02-26 DIAGNOSIS — E669 Obesity, unspecified: Secondary | ICD-10-CM | POA: Diagnosis not present

## 2020-02-26 DIAGNOSIS — R1013 Epigastric pain: Secondary | ICD-10-CM | POA: Diagnosis not present

## 2020-02-26 DIAGNOSIS — F419 Anxiety disorder, unspecified: Secondary | ICD-10-CM | POA: Diagnosis not present

## 2020-02-26 DIAGNOSIS — N200 Calculus of kidney: Secondary | ICD-10-CM | POA: Diagnosis not present

## 2020-02-26 DIAGNOSIS — R1011 Right upper quadrant pain: Secondary | ICD-10-CM | POA: Diagnosis not present

## 2020-03-03 DIAGNOSIS — F3132 Bipolar disorder, current episode depressed, moderate: Secondary | ICD-10-CM | POA: Diagnosis not present

## 2020-03-06 DIAGNOSIS — K2900 Acute gastritis without bleeding: Secondary | ICD-10-CM | POA: Diagnosis not present

## 2020-03-06 DIAGNOSIS — Z87891 Personal history of nicotine dependence: Secondary | ICD-10-CM | POA: Diagnosis not present

## 2020-03-06 DIAGNOSIS — R1013 Epigastric pain: Secondary | ICD-10-CM | POA: Diagnosis not present

## 2020-03-06 DIAGNOSIS — R112 Nausea with vomiting, unspecified: Secondary | ICD-10-CM | POA: Diagnosis not present

## 2020-03-09 DIAGNOSIS — F419 Anxiety disorder, unspecified: Secondary | ICD-10-CM | POA: Diagnosis not present

## 2020-03-09 DIAGNOSIS — R52 Pain, unspecified: Secondary | ICD-10-CM | POA: Diagnosis not present

## 2020-03-09 DIAGNOSIS — R58 Hemorrhage, not elsewhere classified: Secondary | ICD-10-CM | POA: Diagnosis not present

## 2020-03-09 DIAGNOSIS — W01190A Fall on same level from slipping, tripping and stumbling with subsequent striking against furniture, initial encounter: Secondary | ICD-10-CM | POA: Diagnosis not present

## 2020-03-09 DIAGNOSIS — Z87891 Personal history of nicotine dependence: Secondary | ICD-10-CM | POA: Diagnosis not present

## 2020-03-09 DIAGNOSIS — S199XXA Unspecified injury of neck, initial encounter: Secondary | ICD-10-CM | POA: Diagnosis not present

## 2020-03-09 DIAGNOSIS — J45909 Unspecified asthma, uncomplicated: Secondary | ICD-10-CM | POA: Diagnosis not present

## 2020-03-09 DIAGNOSIS — M542 Cervicalgia: Secondary | ICD-10-CM | POA: Diagnosis not present

## 2020-03-09 DIAGNOSIS — S0181XA Laceration without foreign body of other part of head, initial encounter: Secondary | ICD-10-CM | POA: Diagnosis not present

## 2020-03-09 DIAGNOSIS — W19XXXA Unspecified fall, initial encounter: Secondary | ICD-10-CM | POA: Diagnosis not present

## 2020-03-09 DIAGNOSIS — S0990XA Unspecified injury of head, initial encounter: Secondary | ICD-10-CM | POA: Diagnosis not present

## 2020-03-11 DIAGNOSIS — Z713 Dietary counseling and surveillance: Secondary | ICD-10-CM | POA: Diagnosis not present

## 2020-03-12 DIAGNOSIS — F3132 Bipolar disorder, current episode depressed, moderate: Secondary | ICD-10-CM | POA: Diagnosis not present

## 2020-03-24 DIAGNOSIS — F3130 Bipolar disorder, current episode depressed, mild or moderate severity, unspecified: Secondary | ICD-10-CM | POA: Diagnosis not present

## 2020-03-24 DIAGNOSIS — Z205 Contact with and (suspected) exposure to viral hepatitis: Secondary | ICD-10-CM | POA: Diagnosis not present

## 2020-03-24 DIAGNOSIS — Z113 Encounter for screening for infections with a predominantly sexual mode of transmission: Secondary | ICD-10-CM | POA: Diagnosis not present

## 2020-03-24 DIAGNOSIS — R531 Weakness: Secondary | ICD-10-CM | POA: Diagnosis not present

## 2020-04-14 DIAGNOSIS — R112 Nausea with vomiting, unspecified: Secondary | ICD-10-CM | POA: Diagnosis not present

## 2020-04-14 DIAGNOSIS — R1010 Upper abdominal pain, unspecified: Secondary | ICD-10-CM | POA: Diagnosis not present

## 2020-04-14 DIAGNOSIS — Z86010 Personal history of colonic polyps: Secondary | ICD-10-CM | POA: Diagnosis not present

## 2020-04-14 DIAGNOSIS — K920 Hematemesis: Secondary | ICD-10-CM | POA: Diagnosis not present

## 2020-04-14 DIAGNOSIS — R197 Diarrhea, unspecified: Secondary | ICD-10-CM | POA: Diagnosis not present

## 2020-04-15 DIAGNOSIS — E669 Obesity, unspecified: Secondary | ICD-10-CM | POA: Diagnosis not present

## 2020-04-15 DIAGNOSIS — F3132 Bipolar disorder, current episode depressed, moderate: Secondary | ICD-10-CM | POA: Diagnosis not present

## 2020-04-15 DIAGNOSIS — F4310 Post-traumatic stress disorder, unspecified: Secondary | ICD-10-CM | POA: Diagnosis not present

## 2020-04-15 DIAGNOSIS — F419 Anxiety disorder, unspecified: Secondary | ICD-10-CM | POA: Diagnosis not present

## 2020-04-16 DIAGNOSIS — F3132 Bipolar disorder, current episode depressed, moderate: Secondary | ICD-10-CM | POA: Diagnosis not present

## 2020-04-21 DIAGNOSIS — F3132 Bipolar disorder, current episode depressed, moderate: Secondary | ICD-10-CM | POA: Diagnosis not present

## 2020-04-21 DIAGNOSIS — Z79899 Other long term (current) drug therapy: Secondary | ICD-10-CM | POA: Diagnosis not present

## 2020-04-21 DIAGNOSIS — F319 Bipolar disorder, unspecified: Secondary | ICD-10-CM | POA: Diagnosis not present

## 2020-04-21 DIAGNOSIS — Z5181 Encounter for therapeutic drug level monitoring: Secondary | ICD-10-CM | POA: Diagnosis not present

## 2020-04-22 DIAGNOSIS — F3132 Bipolar disorder, current episode depressed, moderate: Secondary | ICD-10-CM | POA: Diagnosis not present

## 2020-04-23 DIAGNOSIS — F3132 Bipolar disorder, current episode depressed, moderate: Secondary | ICD-10-CM | POA: Diagnosis not present

## 2020-04-23 DIAGNOSIS — Z79899 Other long term (current) drug therapy: Secondary | ICD-10-CM | POA: Diagnosis not present

## 2020-04-23 DIAGNOSIS — Z113 Encounter for screening for infections with a predominantly sexual mode of transmission: Secondary | ICD-10-CM | POA: Diagnosis not present

## 2020-04-26 DIAGNOSIS — F3132 Bipolar disorder, current episode depressed, moderate: Secondary | ICD-10-CM | POA: Diagnosis not present

## 2020-04-28 DIAGNOSIS — F3132 Bipolar disorder, current episode depressed, moderate: Secondary | ICD-10-CM | POA: Diagnosis not present

## 2020-04-29 DIAGNOSIS — F3132 Bipolar disorder, current episode depressed, moderate: Secondary | ICD-10-CM | POA: Diagnosis not present

## 2020-05-03 DIAGNOSIS — F3132 Bipolar disorder, current episode depressed, moderate: Secondary | ICD-10-CM | POA: Diagnosis not present

## 2020-05-04 DIAGNOSIS — F3132 Bipolar disorder, current episode depressed, moderate: Secondary | ICD-10-CM | POA: Diagnosis not present

## 2020-05-05 DIAGNOSIS — F3132 Bipolar disorder, current episode depressed, moderate: Secondary | ICD-10-CM | POA: Diagnosis not present

## 2020-05-11 DIAGNOSIS — K621 Rectal polyp: Secondary | ICD-10-CM | POA: Diagnosis not present

## 2020-05-11 DIAGNOSIS — R197 Diarrhea, unspecified: Secondary | ICD-10-CM | POA: Diagnosis not present

## 2020-05-11 DIAGNOSIS — K635 Polyp of colon: Secondary | ICD-10-CM | POA: Diagnosis not present

## 2020-05-11 DIAGNOSIS — K319 Disease of stomach and duodenum, unspecified: Secondary | ICD-10-CM | POA: Diagnosis not present

## 2020-05-11 DIAGNOSIS — K921 Melena: Secondary | ICD-10-CM | POA: Diagnosis not present

## 2020-05-11 DIAGNOSIS — R112 Nausea with vomiting, unspecified: Secondary | ICD-10-CM | POA: Diagnosis not present

## 2020-05-12 DIAGNOSIS — F3132 Bipolar disorder, current episode depressed, moderate: Secondary | ICD-10-CM | POA: Diagnosis not present

## 2020-05-13 DIAGNOSIS — F3132 Bipolar disorder, current episode depressed, moderate: Secondary | ICD-10-CM | POA: Diagnosis not present

## 2020-05-14 DIAGNOSIS — F3132 Bipolar disorder, current episode depressed, moderate: Secondary | ICD-10-CM | POA: Diagnosis not present

## 2020-05-17 DIAGNOSIS — Z79899 Other long term (current) drug therapy: Secondary | ICD-10-CM | POA: Diagnosis not present

## 2020-05-17 DIAGNOSIS — Z5181 Encounter for therapeutic drug level monitoring: Secondary | ICD-10-CM | POA: Diagnosis not present

## 2020-05-17 DIAGNOSIS — F3132 Bipolar disorder, current episode depressed, moderate: Secondary | ICD-10-CM | POA: Diagnosis not present

## 2020-05-18 DIAGNOSIS — F319 Bipolar disorder, unspecified: Secondary | ICD-10-CM | POA: Diagnosis not present

## 2020-05-20 DIAGNOSIS — F419 Anxiety disorder, unspecified: Secondary | ICD-10-CM | POA: Diagnosis not present

## 2020-05-20 DIAGNOSIS — G2401 Drug induced subacute dyskinesia: Secondary | ICD-10-CM | POA: Diagnosis not present

## 2020-05-20 DIAGNOSIS — F3132 Bipolar disorder, current episode depressed, moderate: Secondary | ICD-10-CM | POA: Diagnosis not present

## 2020-05-20 DIAGNOSIS — F4310 Post-traumatic stress disorder, unspecified: Secondary | ICD-10-CM | POA: Diagnosis not present

## 2020-05-21 DIAGNOSIS — F3132 Bipolar disorder, current episode depressed, moderate: Secondary | ICD-10-CM | POA: Diagnosis not present

## 2020-05-31 DIAGNOSIS — K219 Gastro-esophageal reflux disease without esophagitis: Secondary | ICD-10-CM | POA: Diagnosis not present

## 2020-05-31 DIAGNOSIS — F4310 Post-traumatic stress disorder, unspecified: Secondary | ICD-10-CM | POA: Diagnosis not present

## 2020-05-31 DIAGNOSIS — F319 Bipolar disorder, unspecified: Secondary | ICD-10-CM | POA: Diagnosis not present

## 2020-05-31 DIAGNOSIS — F309 Manic episode, unspecified: Secondary | ICD-10-CM | POA: Diagnosis not present

## 2020-05-31 DIAGNOSIS — R45851 Suicidal ideations: Secondary | ICD-10-CM | POA: Diagnosis not present

## 2020-05-31 DIAGNOSIS — F3130 Bipolar disorder, current episode depressed, mild or moderate severity, unspecified: Secondary | ICD-10-CM | POA: Diagnosis not present

## 2020-06-01 DIAGNOSIS — F4310 Post-traumatic stress disorder, unspecified: Secondary | ICD-10-CM | POA: Diagnosis not present

## 2020-06-01 DIAGNOSIS — Z91030 Bee allergy status: Secondary | ICD-10-CM | POA: Diagnosis not present

## 2020-06-01 DIAGNOSIS — Z98818 Other dental procedure status: Secondary | ICD-10-CM | POA: Diagnosis not present

## 2020-06-01 DIAGNOSIS — Z87891 Personal history of nicotine dependence: Secondary | ICD-10-CM | POA: Diagnosis not present

## 2020-06-01 DIAGNOSIS — Z9089 Acquired absence of other organs: Secondary | ICD-10-CM | POA: Diagnosis not present

## 2020-06-01 DIAGNOSIS — F309 Manic episode, unspecified: Secondary | ICD-10-CM | POA: Diagnosis not present

## 2020-06-01 DIAGNOSIS — F319 Bipolar disorder, unspecified: Secondary | ICD-10-CM | POA: Diagnosis not present

## 2020-06-01 DIAGNOSIS — Z888 Allergy status to other drugs, medicaments and biological substances status: Secondary | ICD-10-CM | POA: Diagnosis not present

## 2020-06-01 DIAGNOSIS — K219 Gastro-esophageal reflux disease without esophagitis: Secondary | ICD-10-CM | POA: Diagnosis not present

## 2020-06-01 DIAGNOSIS — Z885 Allergy status to narcotic agent status: Secondary | ICD-10-CM | POA: Diagnosis not present

## 2020-06-01 DIAGNOSIS — N189 Chronic kidney disease, unspecified: Secondary | ICD-10-CM | POA: Diagnosis not present

## 2020-06-01 DIAGNOSIS — Z7951 Long term (current) use of inhaled steroids: Secondary | ICD-10-CM | POA: Diagnosis not present

## 2020-06-01 DIAGNOSIS — R45851 Suicidal ideations: Secondary | ICD-10-CM | POA: Diagnosis not present

## 2020-06-01 DIAGNOSIS — F315 Bipolar disorder, current episode depressed, severe, with psychotic features: Secondary | ICD-10-CM | POA: Diagnosis not present

## 2020-06-01 DIAGNOSIS — R9431 Abnormal electrocardiogram [ECG] [EKG]: Secondary | ICD-10-CM | POA: Diagnosis not present

## 2020-06-01 DIAGNOSIS — F603 Borderline personality disorder: Secondary | ICD-10-CM | POA: Diagnosis not present

## 2020-06-01 DIAGNOSIS — F3130 Bipolar disorder, current episode depressed, mild or moderate severity, unspecified: Secondary | ICD-10-CM | POA: Diagnosis not present

## 2020-06-01 DIAGNOSIS — Z20822 Contact with and (suspected) exposure to covid-19: Secondary | ICD-10-CM | POA: Diagnosis not present

## 2020-06-01 DIAGNOSIS — M549 Dorsalgia, unspecified: Secondary | ICD-10-CM | POA: Diagnosis not present

## 2020-06-24 DIAGNOSIS — M545 Low back pain: Secondary | ICD-10-CM | POA: Diagnosis not present

## 2020-07-09 DIAGNOSIS — M545 Low back pain: Secondary | ICD-10-CM | POA: Diagnosis not present

## 2020-08-13 DIAGNOSIS — N6489 Other specified disorders of breast: Secondary | ICD-10-CM | POA: Diagnosis not present

## 2020-08-13 DIAGNOSIS — R922 Inconclusive mammogram: Secondary | ICD-10-CM | POA: Diagnosis not present

## 2020-09-03 DIAGNOSIS — M545 Low back pain: Secondary | ICD-10-CM | POA: Diagnosis not present

## 2020-09-03 DIAGNOSIS — R202 Paresthesia of skin: Secondary | ICD-10-CM | POA: Diagnosis not present

## 2020-10-11 DIAGNOSIS — M5416 Radiculopathy, lumbar region: Secondary | ICD-10-CM | POA: Diagnosis not present

## 2020-10-21 DIAGNOSIS — F419 Anxiety disorder, unspecified: Secondary | ICD-10-CM | POA: Diagnosis not present

## 2020-10-21 DIAGNOSIS — F4310 Post-traumatic stress disorder, unspecified: Secondary | ICD-10-CM | POA: Diagnosis not present

## 2020-10-21 DIAGNOSIS — F3132 Bipolar disorder, current episode depressed, moderate: Secondary | ICD-10-CM | POA: Diagnosis not present

## 2020-10-21 DIAGNOSIS — G2401 Drug induced subacute dyskinesia: Secondary | ICD-10-CM | POA: Diagnosis not present

## 2020-11-01 DIAGNOSIS — M6281 Muscle weakness (generalized): Secondary | ICD-10-CM | POA: Diagnosis not present

## 2020-11-01 DIAGNOSIS — M5416 Radiculopathy, lumbar region: Secondary | ICD-10-CM | POA: Diagnosis not present

## 2020-11-12 DIAGNOSIS — F3132 Bipolar disorder, current episode depressed, moderate: Secondary | ICD-10-CM | POA: Diagnosis not present

## 2020-11-12 DIAGNOSIS — F419 Anxiety disorder, unspecified: Secondary | ICD-10-CM | POA: Diagnosis not present

## 2020-11-12 DIAGNOSIS — G2401 Drug induced subacute dyskinesia: Secondary | ICD-10-CM | POA: Diagnosis not present

## 2020-11-12 DIAGNOSIS — F4310 Post-traumatic stress disorder, unspecified: Secondary | ICD-10-CM | POA: Diagnosis not present

## 2020-11-16 DIAGNOSIS — F4310 Post-traumatic stress disorder, unspecified: Secondary | ICD-10-CM | POA: Diagnosis not present

## 2020-11-16 DIAGNOSIS — F319 Bipolar disorder, unspecified: Secondary | ICD-10-CM | POA: Diagnosis not present

## 2020-11-18 DIAGNOSIS — F329 Major depressive disorder, single episode, unspecified: Secondary | ICD-10-CM | POA: Diagnosis not present

## 2020-11-19 DIAGNOSIS — M47817 Spondylosis without myelopathy or radiculopathy, lumbosacral region: Secondary | ICD-10-CM | POA: Diagnosis not present

## 2020-11-21 DIAGNOSIS — F319 Bipolar disorder, unspecified: Secondary | ICD-10-CM | POA: Diagnosis not present

## 2020-11-21 DIAGNOSIS — F4310 Post-traumatic stress disorder, unspecified: Secondary | ICD-10-CM | POA: Diagnosis not present

## 2020-12-01 DIAGNOSIS — F4310 Post-traumatic stress disorder, unspecified: Secondary | ICD-10-CM | POA: Diagnosis not present

## 2020-12-01 DIAGNOSIS — F3164 Bipolar disorder, current episode mixed, severe, with psychotic features: Secondary | ICD-10-CM | POA: Diagnosis not present

## 2020-12-02 DIAGNOSIS — F3164 Bipolar disorder, current episode mixed, severe, with psychotic features: Secondary | ICD-10-CM | POA: Diagnosis not present

## 2020-12-02 DIAGNOSIS — I10 Essential (primary) hypertension: Secondary | ICD-10-CM | POA: Diagnosis not present

## 2020-12-02 DIAGNOSIS — F4310 Post-traumatic stress disorder, unspecified: Secondary | ICD-10-CM | POA: Diagnosis not present

## 2020-12-03 DIAGNOSIS — F4310 Post-traumatic stress disorder, unspecified: Secondary | ICD-10-CM | POA: Diagnosis not present

## 2020-12-03 DIAGNOSIS — F3164 Bipolar disorder, current episode mixed, severe, with psychotic features: Secondary | ICD-10-CM | POA: Diagnosis not present

## 2020-12-04 DIAGNOSIS — F3164 Bipolar disorder, current episode mixed, severe, with psychotic features: Secondary | ICD-10-CM | POA: Diagnosis not present

## 2020-12-04 DIAGNOSIS — F4310 Post-traumatic stress disorder, unspecified: Secondary | ICD-10-CM | POA: Diagnosis not present
